# Patient Record
Sex: Female | Born: 1960 | Race: Black or African American | Hispanic: No | Marital: Single | Smoking: Current every day smoker
Health system: Southern US, Community
[De-identification: ages and names within clinical notes are randomized; demographics above are authoritative.]

## PROBLEM LIST (undated history)

## (undated) DIAGNOSIS — I10 Essential (primary) hypertension: Secondary | ICD-10-CM

## (undated) HISTORY — PX: COLONOSCOPY: SHX174

## (undated) HISTORY — DX: Essential (primary) hypertension: I10

---

## 2006-07-28 ENCOUNTER — Ambulatory Visit (HOSPITAL_COMMUNITY): Admission: RE | Admit: 2006-07-28 | Discharge: 2006-07-28 | Payer: Self-pay | Admitting: Obstetrics & Gynecology

## 2007-12-11 ENCOUNTER — Ambulatory Visit (HOSPITAL_COMMUNITY): Admission: RE | Admit: 2007-12-11 | Discharge: 2007-12-11 | Payer: Self-pay | Admitting: Family Medicine

## 2007-12-14 ENCOUNTER — Ambulatory Visit: Payer: Self-pay | Admitting: Obstetrics and Gynecology

## 2007-12-25 ENCOUNTER — Ambulatory Visit: Payer: Self-pay | Admitting: Internal Medicine

## 2007-12-27 ENCOUNTER — Ambulatory Visit: Payer: Self-pay | Admitting: *Deleted

## 2008-02-15 ENCOUNTER — Ambulatory Visit: Payer: Self-pay | Admitting: Obstetrics & Gynecology

## 2008-03-19 ENCOUNTER — Ambulatory Visit: Payer: Self-pay | Admitting: Obstetrics & Gynecology

## 2008-03-19 ENCOUNTER — Ambulatory Visit: Payer: Self-pay | Admitting: Internal Medicine

## 2008-03-19 LAB — CONVERTED CEMR LAB
Albumin: 4.4 g/dL (ref 3.5–5.2)
Basophils Relative: 2 % — ABNORMAL HIGH (ref 0–1)
CO2: 24 meq/L (ref 19–32)
Creatinine, Ser: 0.72 mg/dL (ref 0.40–1.20)
Eosinophils Absolute: 0.1 10*3/uL (ref 0.0–0.7)
Eosinophils Relative: 1 % (ref 0–5)
HCT: 40.5 % (ref 36.0–46.0)
Lymphs Abs: 2.1 10*3/uL (ref 0.7–4.0)
MCV: 95.3 fL (ref 78.0–100.0)
Monocytes Absolute: 0.6 10*3/uL (ref 0.1–1.0)
Monocytes Relative: 8 % (ref 3–12)
Neutro Abs: 4.6 10*3/uL (ref 1.7–7.7)
Neutrophils Relative %: 61 % (ref 43–77)
Potassium: 3.8 meq/L (ref 3.5–5.3)
RBC: 4.25 M/uL (ref 3.87–5.11)
RDW: 13.8 % (ref 11.5–15.5)
Sodium: 140 meq/L (ref 135–145)
Total Bilirubin: 0.4 mg/dL (ref 0.3–1.2)
Total Protein: 7.2 g/dL (ref 6.0–8.3)
WBC: 7.5 10*3/uL (ref 4.0–10.5)

## 2008-04-02 ENCOUNTER — Ambulatory Visit: Payer: Self-pay | Admitting: Obstetrics & Gynecology

## 2008-04-02 ENCOUNTER — Inpatient Hospital Stay (HOSPITAL_COMMUNITY): Admission: RE | Admit: 2008-04-02 | Discharge: 2008-04-05 | Payer: Self-pay | Admitting: Obstetrics & Gynecology

## 2008-04-02 ENCOUNTER — Encounter: Payer: Self-pay | Admitting: Obstetrics & Gynecology

## 2008-04-12 ENCOUNTER — Ambulatory Visit: Payer: Self-pay | Admitting: Obstetrics & Gynecology

## 2008-05-03 ENCOUNTER — Ambulatory Visit: Payer: Self-pay | Admitting: Obstetrics & Gynecology

## 2009-11-23 IMAGING — US US PELVIS COMPLETE MODIFY
1 series · 13 of 25 positions shown · non-contrast
Comparison: None

CLINICAL DATA: Large pelvic mass.  Cannot visualize cervix on GYN
exam.

TRANSABDOMINAL AND TRANSVAGINAL ULTRASOUND OF PELVIS
TECHNIQUE: Both transabdominal and transvaginal ultrasound
examinations of the pelvis were performed including evaluation of
the uterus, ovaries, adnexal regions, and pelvic cul-de-sac.

[Series 1: us pelvis complete modify · 0.33mm/px · 13 of 43 slices shown]
[im 1/43]
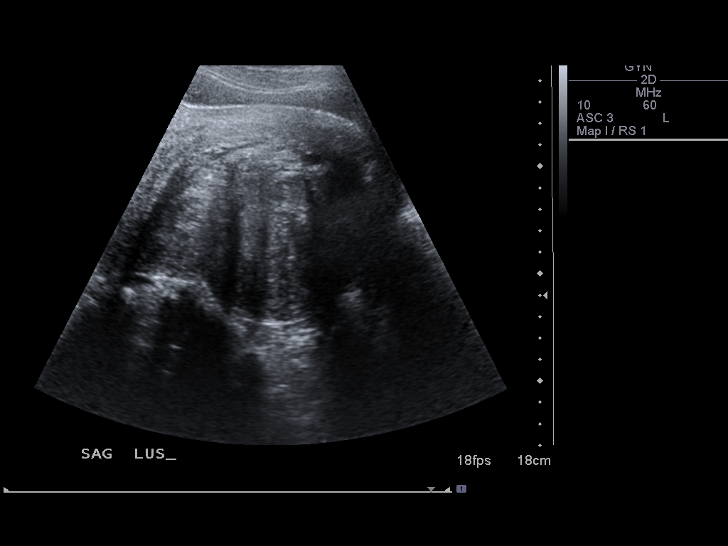
[im 4/43]
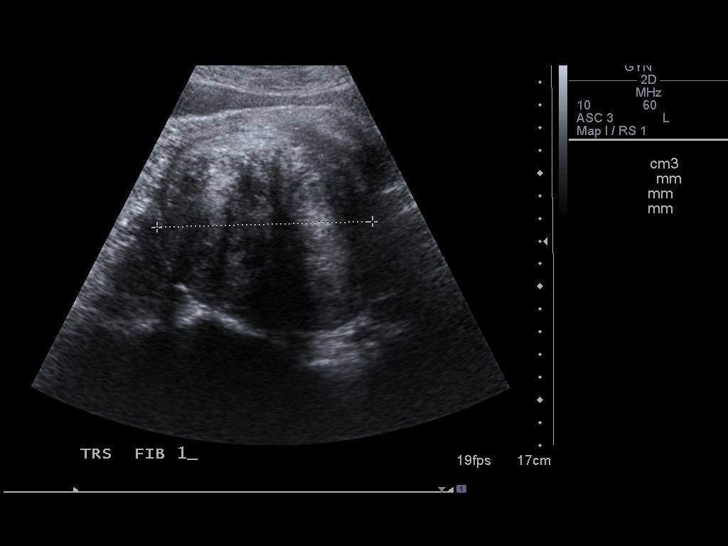
[im 8/43]
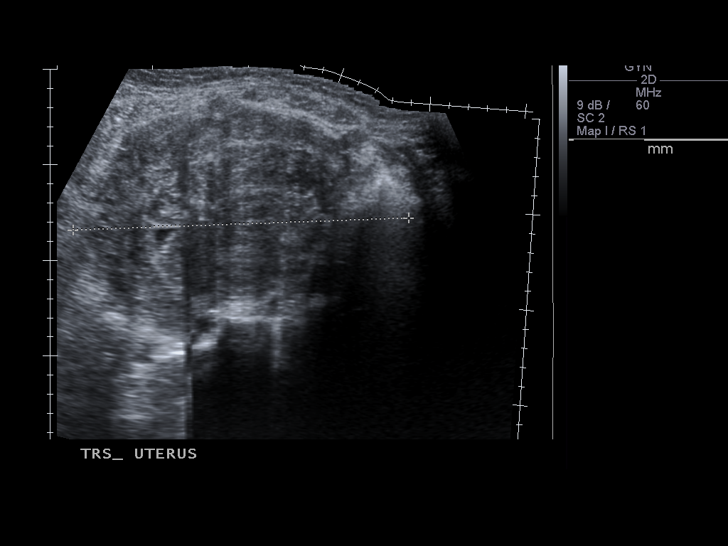
[im 11/43]
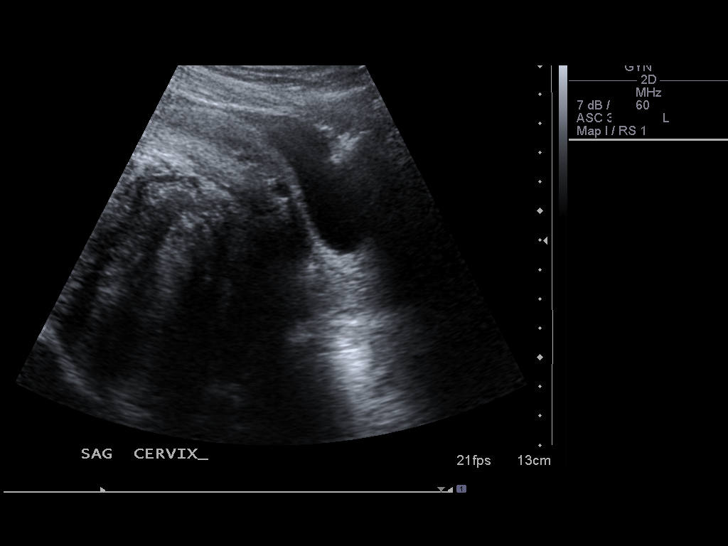
[im 15/43]
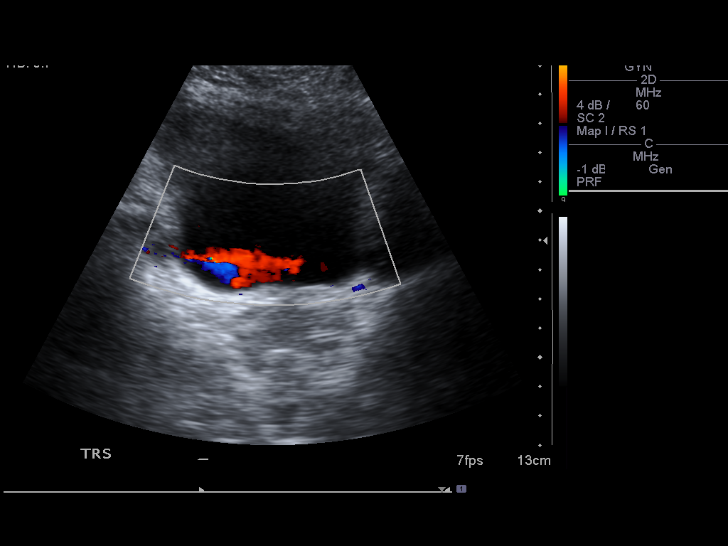
[im 18/43]
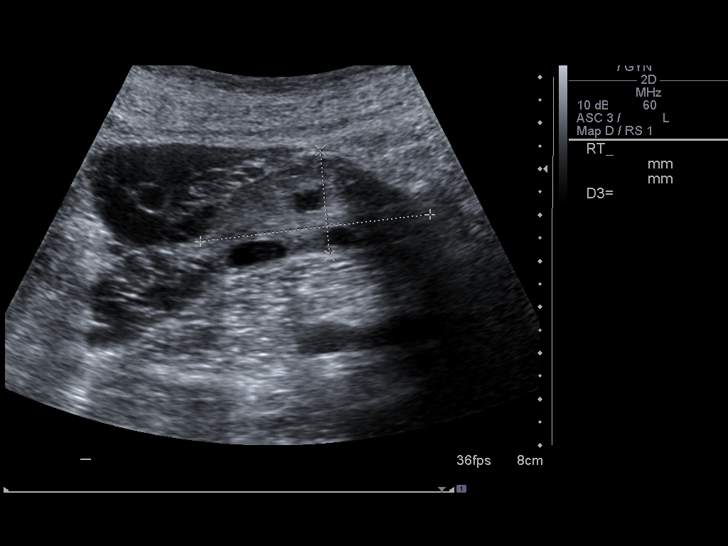
[im 22/43]
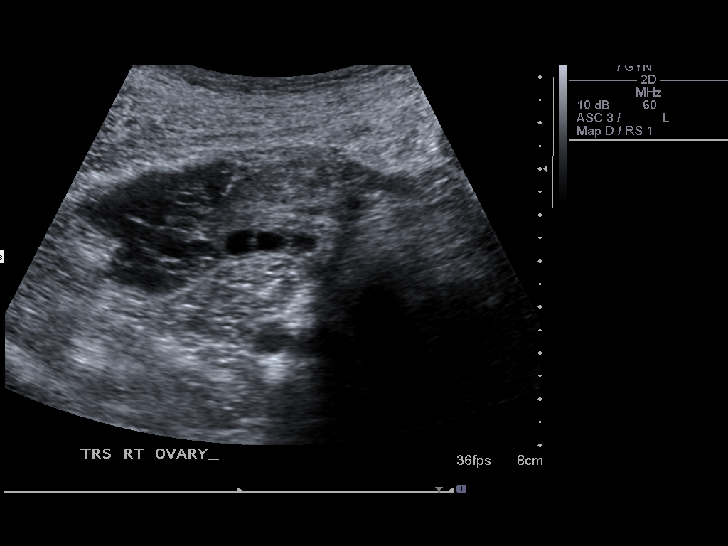
[im 25/43]
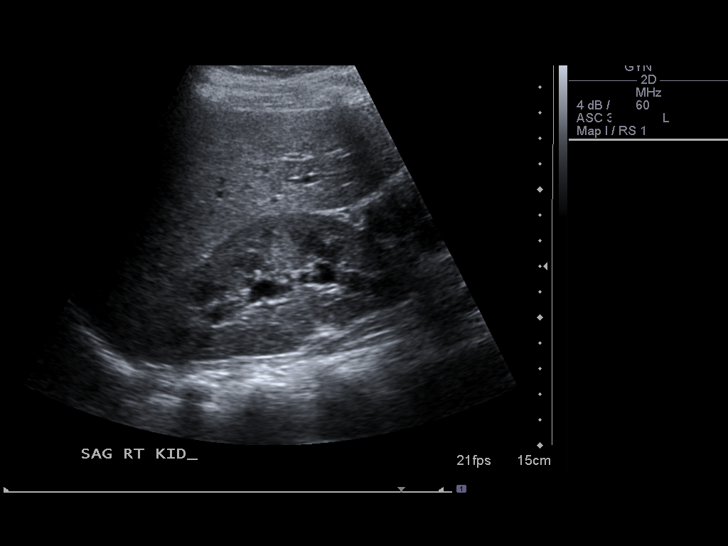
[im 29/43]
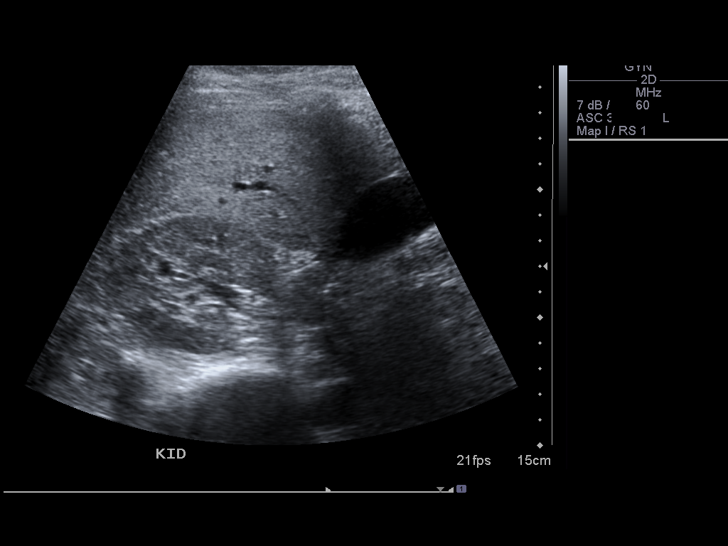
[im 32/43]
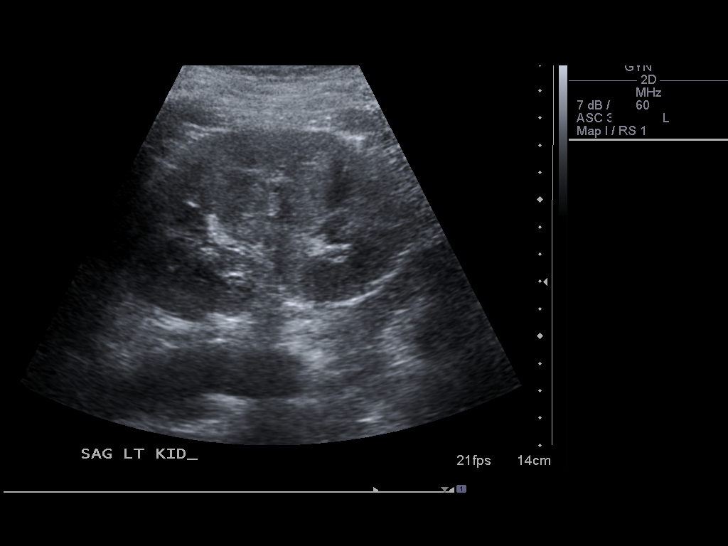
[im 36/43]
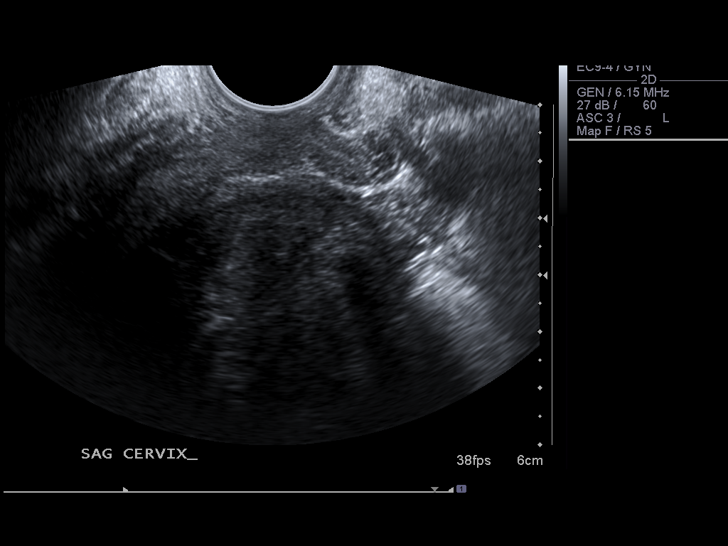
[im 39/43]
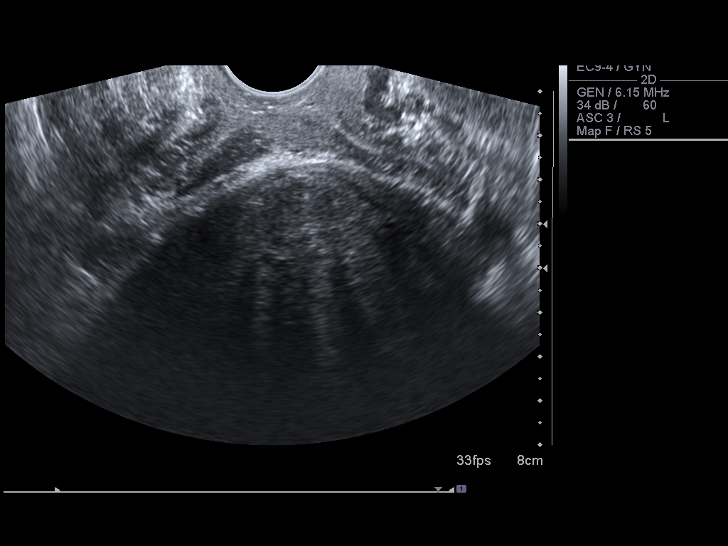
[im 43/43]
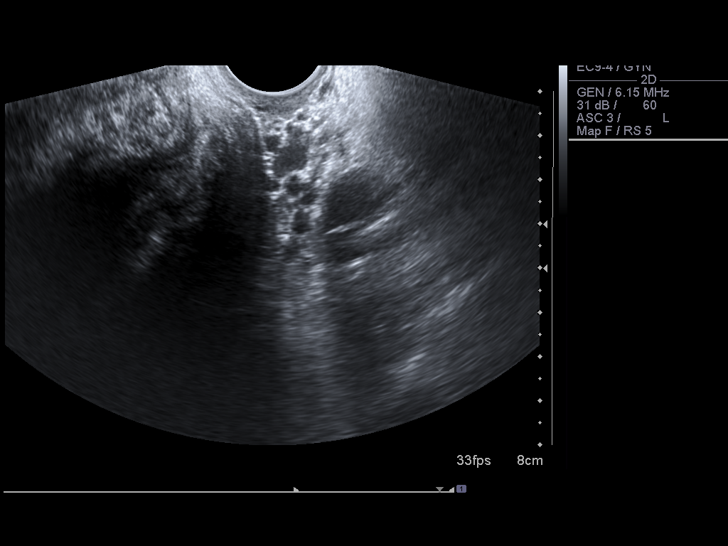

[13 of 25 positions shown; findings below may reference images not displayed]

FINDINGS: The uterus is markedly enlarged, measuring approximately
26 cm length by 11 cm AP diameter by 18 cm and transverse diameter.

[place] distinct fibroids are visualized.  The largest of these is
located in the posterior uterine body measuring 13.5 x 11.5 x
cm.  This fibroid has a submucosal component displacing the
endometrial cavity anteriorly.  Endometrial thickness measures 7
mm.

Two other fibroids are noted in the uterine fundus, measuring
x 9.0 x 9.5 cm and 6.3 x 6.0 x 5.5 cm.

The right ovary is visualized on transabdominal sonography and is
normal in appearance.  The left ovary is not directly visualized
but no adnexal masses are seen by transabdominal or transvaginal
approach.

Limited images of the kidneys show minimal right renal
pelvicaliectasis.  There is no evidence of left renal
pelvicaliectasis.
IMPRESSION: 1.  Markedly enlarged uterus, with at least three fibroids
measuring up to 13.5 cm.
2.  Normal right ovary.  Nonvisualization of left ovary.
3.  Minimal right renal pelvicaliectasis noted.

## 2010-04-26 HISTORY — PX: TOTAL ABDOMINAL HYSTERECTOMY: SHX209

## 2010-05-17 ENCOUNTER — Encounter: Payer: Self-pay | Admitting: *Deleted

## 2010-09-08 NOTE — Discharge Summary (Signed)
Michele George, Michele George              ACCOUNT NO.:  [id number]   MEDICAL RECORD NO.:  [id number]          PATIENT TYPE:  INP   LOCATION:  9306                          [facility]ILITY[facility]   PHYSICIAN:  Norton Blizzard, MD    DATE OF BIRTH:  08-15-1960   DATE OF ADMISSION:  04/02/2008  DATE OF DISCHARGE:  04/05/2008                               DISCHARGE SUMMARY   ADMISSION DIAGNOSIS:  Symptomatic fibroid uterus.   DISCHARGE DIAGNOSIS:  Status post total abdominal hysterectomy.   PERTINENT LABORATORY DATA:  Preoperative hemoglobin was 14 and a  postoperative hemoglobin was 11.3 with a platelet count of 260,000.   BRIEF HOSPITAL COURSE:  The patient is a 50 year old gravida 2, para 2,  who was seen in the clinic for a large fibroid uterus measuring 26 cm x  11 cm x 18 cm.  The patient did receive a dose of Depo-Lupron, but this  did not result in significant reduction of the bulk of her fibroid  uterus.  She underwent total abdominal hysterectomy on April 02, 2008,  which was uncomplicated.  For further details of the surgery, please  refer to separate dictated operative report.  On the day of discharge,  the patient was ambulating and voiding without difficulty.  Her pain was  controlled on oral pain medications.  She was tolerating a regular diet,  passing flatus, and also had a bowel movement.  She was deemed stable  for discharge to home.   DISCHARGE CONDITION:  Stable.   DISCHARGE INSTRUCTIONS:  The patient was given the routine postoperative  instructions including no driving while on Percocet, no lifting anything  greater than 15 pounds for the next 8 weeks, no sexual activity for the  next 8 weeks.  She was told she could shower, but to avoid sitting in  the bathtub or in pools of water.  She was told to keep her incision  clean and dry.   MEDICATIONS ON DISCHARGE:  The patient was told to resume her home  medications of Toprol and Hydrochlorothiazide as prescribed by her  primary care doctor.  She was also prescribed Percocet 5/325 one to two  tablets p.o. q.6 h p.r.n. pain, Ibuprofen 600 mg p.o. q.6 h. p.r.n.  pain, Colace 100 mg p.o. b.i.d. p.r.n. constipation.   FOLLOWUP APPOINTMENTS:  The patient is to return to the GYN Clinic for  staple removal on April 11, 2008, at 1:15 p.m.  She also has another  appointment for postoperative evaluation and examination on May 03, 2008, at 9:30 a.m.      Norton Blizzard, MD  Electronically Signed     UAD/MEDQ  D:  04/05/2008  T:  04/05/2008  Job:  [id number]

## 2010-09-08 NOTE — Group Therapy Note (Signed)
NAME:  Michele George, Michele George NO.:  [id number]   MEDICAL RECORD NO.:  [id number]          PATIENT TYPE:  WOC   LOCATION:  [facility]                   FACILITY:  [facility]   PHYSICIAN:  Johnella Moloney, MD        DATE OF BIRTH:  12-24-1960   DATE OF SERVICE:                                  CLINIC NOTE   The patient is a 50 year old gravida 2, para 2, who is status post a  total abdominal hysterectomy and lysis adhesions on April 02, 2008.  The patient was last seen on April 12, 2008, for removal of staples  from her infraumbilical vertical incision.  Since that visit, the  patient reports of having mild soreness around the incision.  She denies  any bleeding or any other concerns.   PHYSICAL EXAMINATION:  VITAL SIGNS:  The patient is afebrile.  Her blood  pressure is elevated to 138/100, but she said that she had not taken her  blood pressure medications today, weight is 155 pounds, and height 5  feet 5 inches.  GENERAL:  No apparent distress.  ABDOMEN:  Soft and nontender.  Incision is healing well.  It is a  vertical infraumbilical incision.  She does have some mild hardening on  the superior aspect of incision, but no erythema or fluctuance that was  noted.  This is just likely due to inflammation of old surgery probably  around the stitch in that area.  There is no intervention to be done  about it.  The patient was just told to keep an eye on that and to alert  [place] if it gets worse.  PELVIC:  The patient has normal external female genitalia.  Speculum  exam revealed pink, well rugated vagina, and the vaginal cuff has healed  very well.  No abnormal drainage or discharge.   IMPRESSION:  The patient is a 50 year old female, here for postoperative  check.  The patient is doing well.  She was told to continue to take  pain medications as needed.  Of note, the patient was noted to have her  last mammogram in October 2008.  A routine mammogram was scheduled for  her at  this visit.  She was told to follow up for annual exam or earlier  for any further gynecologic problems.           ______________________________  Johnella Moloney, MD     UD/MEDQ  D:  05/03/2008  T:  05/04/2008  Job:  [id number]

## 2010-09-08 NOTE — Group Therapy Note (Signed)
Michele George, LINHART NO.:  [id number]   MEDICAL RECORD NO.:  [id number]          PATIENT TYPE:  WOC   LOCATION:  [facility]                   FACILITY:  [facility]   PHYSICIAN:  Argentina Donovan, MD        DATE OF BIRTH:  Sep 05, 1960   DATE OF SERVICE:  12/14/2007                                  CLINIC NOTE   The patient is a 50 year old African American female, gravida 2, para 2-  0-0-2, who was referred from [facility] because of a large fibroid  uterus.  At ultrasound, the uterus measured 26 cm in length x 11 in AP  diameter, and 18 transverse, with fibroids, that make a Pap smear  impossible to do because the cervix is not palpable because of the  pressure.  She also has a slight right renal pelviectasis up from the  top pressure on the ureter from the mass.  She is completely  asymptomatic otherwise and is in good health, with a blood pressure of  135/80, and a pulse of 95.  The patient is 5 feet 5 inches tall and  weighs 176 pounds.  She has no allergies.  She has had no previous  surgery.  She has had 2 normal vaginal deliveries.  She does socially  she does not smoke more than a cigarette every few days.  She drinks no  alcohol and takes no illicit drugs.   The plan is to give the patient Depo-Lupron 11.25 and see her back in 2  months to see how this is shrinking down, hopefully to make the incision  smaller in order to remove this enormous mass.  On bimanual pelvic exam,  the anterior wall of the vagina is pushed in.  The cervix I cannot feel  digitally or see visually, and the anterior wall is completely smoothed  out by the mass, which makes putting a speculum in there difficult,  although she says she is voiding fine and does not have a problem with  her bowel movements.   IMPRESSION:  Symptomatic enormous fibroid tumor.   PLAN:  To shrink down with Depo-Lupron, and eventual hysterectomy.           ______________________________  Argentina Donovan,  MD     PR/MEDQ  D:  12/14/2007  T:  12/14/2007  Job:  [id number]

## 2010-09-08 NOTE — Op Note (Signed)
Michele George, Michele George              ACCOUNT NO.:  [id number]   MEDICAL RECORD NO.:  [id number]          PATIENT TYPE:  INP   LOCATION:  9306                          [facility]   PHYSICIAN:  Norton Blizzard, MD    DATE OF BIRTH:  08-29-1960   DATE OF PROCEDURE:  04/02/2008  DATE OF DISCHARGE:                               OPERATIVE REPORT   PREOPERATIVE DIAGNOSIS:  Symptomatic fibroid uterus.   POSTOPERATIVE DIAGNOSIS:  Symptomatic fibroid uterus.   PROCEDURES:  Total abdominal hysterectomy and lysis of adhesions.   SURGEON:  Norton Blizzard, MD   ASSISTANT:  Shelbie Proctor. Shawnie Pons, MD   ANESTHESIA:  General.   IV FLUIDS:  700 mL of lactated Ringer.   ESTIMATED BLOOD LOSS:  700 mL.   URINE OUTPUT:  75 mL.   INDICATIONS:  The patient is a 50 year old gravida 2, para 2, who  presented with a large symptomatic fibroid uterus initially measuring 26  x 18 x 11 cm on ultrasound.  The patient was given Depo-Lupron 11.25 mg  in the last 3 months to see if this could reduce the bulk of her fibroid  uterus.  Prior to surgery, the risks of total abdominal hysterectomy  including bleeding, infection, injury to surrounding organs, need for  additional procedures were explained to the patient and written informed  consent was obtained.   FINDINGS:  A 24-week size fibroid uterus, omental adhesions at the top  of the uterus, and normal adnexa bilaterally.   SPECIMENS:  Uterus plus cervix.   DISPOSITION:  Specimen to Pathology.   COMPLICATIONS:  None.   PROCEDURE IN DETAIL:  The patient received preoperative IV antibiotics  approximately 30 minutes prior to surgery.  Compression boots were also  applied to the lower extremities in the preoperative area.  The patient  was then taken to the operating room where general anesthesia was placed  and found to be adequate.  The patient was then placed in the dorsal  supine position.  The abdomen and perineum were prepped and draped in  the  usual manner.  A Foley catheter was inserted into the bladder and  attached to constant gravity.  An infraumbilical vertical skin incision  was made in the skin using a scalpel.  This incision was taken down to  the layer of fascia using electrocautery with care given to maintain  good hemostasis.  The fascia was incised in the midline, and the  incision was extended superiorly and inferiorly using electrocautery  without difficulty.  The rectus muscles were split bluntly in the  midline, and the peritoneum was entered bluntly without complication.  This incision was extended superiorly and inferiorly in a blunt fashion  with care given to prevent bowel or bladder injury.  Upon entry into the  abdominal cavity, the fibroid uterus was noted to occupy most of the  pelvis.  An attempt was made to deliver the uterus up through the  incision.  At this point, it was noted that there were omental adhesions  to the top of the uterus.  These adhesions were clamped, and cut  and  suture ligated.  Excellent hemostasis was noted.  At this point, the  uterus was able to be delivered up out of the incision.  The round  ligaments on each side were then recognized, clamped, suture ligated and  transected with electrocautery allowing entry into the broad ligament.  Of note, all stitches used during the surgery with 0 Vicryl unless  otherwise noted. At this point, a bladder flap was created across the  anterior leaf of the broad ligament and the bladder was bluntly  dissected off the lower uterine segment and cervix with good hemostasis.  It was noted that the patient had a large fibroid in the lower uterine  segment, limiting our access  to the uterine vessels.  Decision was made  to proceed with the removal this fibroid in order to debulk uterus.  Vasopressin was injected on the serosal surface of the fibroid and  electrocautery was used to make a transverse incision into the capsule  of the fibroid.  The  fibroid was then grasped with a hook and the  fibroid was freed from the surrounding myometrium using blunt fashion.  Once the fibroid was excised, the defect was closed with a running  suture.  Attention was then turned to the broad ligament where a hole  was created in a posterior broad ligament and adnexa were clamped on the  patient's right side.  The pedicle was then cut and doubly suture  ligated and good hemostasis was noted.  This procedure was repeated in  identical fashion on the left side allowing both adnexae to stay in  place.  The broad ligament was then serially clamped, cut, and ligated  bilaterally.  The uterine arteries were then skeletonized, clamped, cut,  and suture ligated with care given to prevent ureteral injury.  The  uterosacral ligaments were then clamped, cut, and suture ligated  bilaterally. The cardinal ligaments were clamped, cut, and ligated  bilaterally.  Acutely curved clamps were placed across the vagina just  under cervix, and the specimen was amputated and sent to Pathology.  The  vaginal cuff was closed with figure-of-eight sutures with care given to  incorporate the anterior pubocervical fascia in the posterior  rectovaginal fascia.  Good hemostasis was noted.  The pelvis was  irrigated and there were a few areas of bleeding that were controlled  using figure-of-eight stitches.  Overall, good hemostasis was confirmed.  The ureters were examined bilaterally and noted to be peristalsing  freely.  Small amount of bleeding was noted on the bladder flap, and  sutures were placed to control the bleeding.  Avitene was also placed in  this area to help with hemostasis.  Hemostasis was then confirmed on all  pedicles, the vaginal cuff, and along the pelvic sidewall.  The fascia  and peritoneum were closed in end-to-end fashion with a running  continuous looped PDS suture.  The subcutaneous tissues were then  irrigated and hemostasis confirmed.  The skin was  then closed with  staples.  The patient tolerated the procedure well.  Sponge, instrument,  and needle counts were correct x2.  She was taken to the recovery room  in stable condition.      Norton Blizzard, MD  Electronically Signed     UAD/MEDQ  D:  04/02/2008  T:  04/03/2008  Job:  [id number]

## 2010-09-08 NOTE — Group Therapy Note (Signed)
NAME:  Michele George, Michele George NO.:  [id number]   MEDICAL RECORD NO.:  [id number]          PATIENT TYPE:  WOC   LOCATION:  [facility]                   FACILITY:  [facility]   PHYSICIAN:  Johnella Moloney, MD        DATE OF BIRTH:  Jan 15, 1961   DATE OF SERVICE:                                  CLINIC NOTE   HISTORY:  The patient is a 50 year old gravida 2, para 2 who was last  seen on December 14, 2007 for evaluation of a large fibroid uterus.  At  the ultrasound, her fibroid uterus was noted to measure 26 cm x 11 cm x  18 cm and also a Pap smear was unable to be done because her cervix was  not palpable on examination during that encounter.  She was also noted  to have slight right renal pelviectasis from the top of the uterus  producing pressure on the ureter.  She was otherwise asymptomatic.  At  the end of this visit, the patient was given Depo-Lupron 48.25 mg to see  if this could help reduce the bulk of the fibroid uterus.  She was told  to follow up today for reevaluation and for further surgical planning.  Today, the patient reports she continues to have some pain.  She does  report a significant decrease in size of her fibroid uterus over the  past 2 months, but she is very interested in moving ahead with surgical  management.  She says that she has not had any bleeding since the Lupron  injection.   PAST OB/GYN HISTORY:  The patient has a history of 2 vaginal deliveries.  She has had normal menstrual periods since age 50 with are regular. with are regular.  These periods last about 5 days.  Her last menstrual period was in  August 2009 before the Lupron injection.  The patient denies any history  of abnormal Pap smears.  She does say that she had a Pap that was done  in October 2008 which was normal.  She also had a mammogram in October  2008 which was normal.  The patient is not on any birth control method  and is currently sexually active.   PAST MEDICAL HISTORY:  Dental disease.   PAST  SURGICAL HISTORY:  1. Dental surgery.  2. No abdominal surgery.   MEDICATIONS:  Amoxicillin 500 mg p.o. t.i.d.  This medication was  prescribed by the patient's dentist.   ALLERGIES:  NO KNOWN DRUG ALLERGIES.  She is not allergic to latex.   SOCIAL HISTORY:  The patient is currently unemployed.  She does not  smoke, use any illicit drugs or drink alcohol.  She denies any past or  present history of sexual or physical abuse.   FAMILY HISTORY:  History is remarkable for diabetes.  No gynecologic  cancers.   REVIEW OF SYSTEMS:  Only remarkable for weight gain which she attributes  to the enlarging fibroid uterus and abdominal pain.   PHYSICAL EXAMINATION:  VITAL SIGNS:  Temperature 97.6, pulse 102,  respirations 20, blood pressure 133/91, weight 169.1 pounds, height 5  feet 5-1/2 inches.  GENERAL:  No apparent respiratory distress.  LUNGS:  Clear to auscultation bilaterally.  HEART:  Regular rate and rhythm.  BREASTS:  Symmetric in size.  No abnormal masses, drainage, skin changes  or lymphadenopathy noted.  ABDOMEN:  Fibroid uterus was able to be palpated about 2 cm above the  umbilicus.  There was mild tenderness to palpation.  Abdomen was  otherwise soft.  No rebound or guarding on palpation of the fibroid  uterus.  PELVIC:  The patient had normal external female genitalia.  On speculum  examination, I was not able to visualize her cervix.  On bimanual exam,  her cervix was noted to be tucked behind the superior portion of the  pubic symphysis and could not be gotten into view using a speculum.  At  this point, attempt to obtain a Pap smear was aborted.  There was no  cervical motion tenderness.  Adnexa were unable to be palpated due to  large fibroid uterus.  EXTREMITIES:  No cyanosis, clubbing or edema and nontender.   ASSESSMENT/PLAN:  The patient is a 50 year old gravida 2, para 2, who is  seen here for a symptomatic fibroid uterus.  The patient desires  definitive  surgery.  She was told she will be booked for a total  abdominal hysterectomy and a surgical scheduler will be contacting her  in a few days with the time and date.  The patient will get another  injection of Depo-Lupron in November to see if this could debulk her  fibroid uterus any more.  We will decide on the incision on the day of  surgery.  It is either going to be infraumbilical, vertical versus a  Pfannenstiel skin incision.  The patient was told to come back to the  clinic or go to the  emergency room for any further concerns.           ______________________________  Johnella Moloney, MD     UD/MEDQ  D:  02/15/2008  T:  02/15/2008  Job:  [id number]

## 2011-01-29 LAB — CBC
HCT: 41.3 % (ref 36.0–46.0)
Hemoglobin: 11.3 g/dL — ABNORMAL LOW (ref 12.0–15.0)
Hemoglobin: 14 g/dL (ref 12.0–15.0)
MCHC: 33.8 g/dL (ref 30.0–36.0)
MCV: 96.3 fL (ref 78.0–100.0)
RBC: 3.36 MIL/uL — ABNORMAL LOW (ref 3.87–5.11)
RBC: 4.29 MIL/uL (ref 3.87–5.11)
RDW: 13.6 % (ref 11.5–15.5)
WBC: 11.1 10*3/uL — ABNORMAL HIGH (ref 4.0–10.5)

## 2011-01-29 LAB — BASIC METABOLIC PANEL
CO2: 26 mEq/L (ref 19–32)
Calcium: 9.5 mg/dL (ref 8.4–10.5)
Chloride: 99 mEq/L (ref 96–112)
GFR calc Af Amer: >60 mL/min (ref >60)
Glucose, Bld: 115 mg/dL — ABNORMAL HIGH (ref 70–99)
Potassium: 3.6 mEq/L (ref 3.5–5.1)
Sodium: 138 mEq/L (ref 135–145)

## 2011-01-29 LAB — TYPE AND SCREEN: Antibody Screen: NEGATIVE

## 2019-11-27 ENCOUNTER — Ambulatory Visit (HOSPITAL_COMMUNITY)
Admission: EM | Admit: 2019-11-27 | Discharge: 2019-11-27 | Disposition: A | Discharge status: Home / Self Care | Payer: Self-pay | Attending: Family Medicine | Admitting: Family Medicine

## 2019-11-27 ENCOUNTER — Encounter (HOSPITAL_COMMUNITY): Payer: Self-pay

## 2019-11-27 ENCOUNTER — Other Ambulatory Visit: Payer: Self-pay

## 2019-11-27 DIAGNOSIS — I10 Essential (primary) hypertension: Secondary | ICD-10-CM

## 2019-11-27 MED ORDER — AMLODIPINE BESYLATE 5 MG PO TABS: 5.0000 mg | ORAL_TABLET | Freq: Every day | ORAL | 0 refills | Status: DC

## 2019-11-27 NOTE — ED Provider Notes (Signed)
[facility]    CSN: [id number] Arrival date & time: 11/27/19  1614      History   Chief Complaint Chief Complaint  Patient presents with   Hypertension    HPI Michele George is a 59 y.o. female.   She is presenting with elevated blood pressure.  She has no history of hypertension.  She went for a job physical and her blood pressure was noted to be elevated.  She denies any symptoms today.  Has never been treated previously.  HPI  History reviewed. No pertinent past medical history.  There are no problems to display for this patient.   History reviewed. No pertinent surgical history.  OB History   No obstetric history on file.      Home Medications    Prior to Admission medications   Medication Sig Start Date End Date Taking? Authorizing Provider  amLODipine (NORVASC) 5 MG tablet Take 1 tablet (5 mg total) by mouth daily. 11/27/19   Rosemarie Ax, MD    Family History Family History  Family history unknown: Yes    Social History Social History   Tobacco Use   Smoking status: Light Tobacco Smoker  Substance Use Topics   Alcohol use: Not on file   Drug use: Not on file     Allergies   Patient has no known allergies.   Review of Systems Review of Systems\ See HPI  Physical Exam Triage Vital Signs ED Triage Vitals  Enc Vitals Group     BP 11/27/19 1744 (!) 180/117     Pulse Rate 11/27/19 1744 96     Resp 11/27/19 1744 18     Temp 11/27/19 1744 98.1 F (36.7 C)     Temp Source 11/27/19 1744 Oral     SpO2 11/27/19 1744 99 %     Weight --      Height --      Head Circumference --      Peak Flow --      Pain Score 11/27/19 1743 0     Pain Loc --      Pain Edu? --      Excl. in [place]? --    No data found.  Updated Vital Signs BP (!) 175/100 (BP Location: Left Arm)    Pulse 96    Temp 98.1 F (36.7 C) (Oral)    Resp 18    SpO2 99%   Visual Acuity Right Eye Distance:   Left Eye Distance:   Bilateral Distance:    Right  Eye Near:   Left Eye Near:    Bilateral Near:     Physical Exam Gen: NAD, alert, cooperative with exam, well-appearing ENT: normal lips, normal nasal mucosa,  Eye: normal EOM, normal conjunctiva and lids CV: Regular rate and rhythm, S1-S2 Resp: no accessory muscle use, non-labored,  Skin: no rashes, no areas of induration  Neuro: normal tone, normal sensation to touch    UC Treatments / Results  Labs (all labs ordered are listed, but only abnormal results are displayed) Labs Reviewed - No data to display  EKG   Radiology No results found.  Procedures Procedures (including critical care time)  Medications Ordered in UC Medications - No data to display  Initial Impression / Assessment and Plan / UC Course  I have reviewed the triage vital signs and the nursing notes.  Pertinent labs & imaging results that were available during my care of the patient were reviewed by me and  considered in my medical decision making (see chart for details).     [name] is a 59 year old female is presenting with new onset hypertension.  Provide amlodipine and counseled on having close follow-up and daily monitoring.  Final Clinical Impressions(s) / UC Diagnoses   Final diagnoses:  Essential hypertension     Discharge Instructions     Please continue to check your blood pressure  If the readings on the blood pressure are above 140/90 then please follow up      ED Prescriptions    Medication Sig Dispense Auth. Provider   amLODipine (NORVASC) 5 MG tablet Take 1 tablet (5 mg total) by mouth daily. 30 tablet Rosemarie Ax, MD     PDMP not reviewed this encounter.   Rosemarie Ax, MD 11/27/19 [phone]

## 2019-11-27 NOTE — Discharge Instructions (Signed)
Please continue to check your blood pressure  If the readings on the blood pressure are above 140/90 then please follow up

## 2019-11-27 NOTE — ED Triage Notes (Signed)
Pt presents with elevated blood pressure today.

## 2021-05-19 ENCOUNTER — Ambulatory Visit: Payer: Self-pay | Admitting: Nurse Practitioner

## 2021-05-19 ENCOUNTER — Other Ambulatory Visit: Payer: Self-pay

## 2021-05-27 ENCOUNTER — Encounter: Payer: Self-pay | Admitting: Family

## 2021-05-28 ENCOUNTER — Encounter: Payer: Self-pay | Admitting: Family

## 2021-05-28 ENCOUNTER — Ambulatory Visit (INDEPENDENT_AMBULATORY_CARE_PROVIDER_SITE_OTHER): Payer: 59 | Admitting: Family

## 2021-05-28 ENCOUNTER — Other Ambulatory Visit: Payer: Self-pay

## 2021-05-28 VITALS — BP 140/100 | HR 102 | Temp 97.8°F | Resp 16 | Ht 66.34 in | Wt 181.2 lb

## 2021-05-28 DIAGNOSIS — Z7689 Persons encountering health services in other specified circumstances: Secondary | ICD-10-CM

## 2021-05-28 DIAGNOSIS — I10 Essential (primary) hypertension: Secondary | ICD-10-CM

## 2021-05-28 DIAGNOSIS — E663 Overweight: Secondary | ICD-10-CM

## 2021-05-28 DIAGNOSIS — R000 Tachycardia, unspecified: Secondary | ICD-10-CM

## 2021-05-28 DIAGNOSIS — H04123 Dry eye syndrome of bilateral lacrimal glands: Secondary | ICD-10-CM | POA: Diagnosis not present

## 2021-05-28 DIAGNOSIS — F17209 Nicotine dependence, unspecified, with unspecified nicotine-induced disorders: Secondary | ICD-10-CM

## 2021-05-28 DIAGNOSIS — Z6828 Body mass index (BMI) 28.0-28.9, adult: Secondary | ICD-10-CM

## 2021-05-28 DIAGNOSIS — Z716 Tobacco abuse counseling: Secondary | ICD-10-CM

## 2021-05-28 DIAGNOSIS — F5101 Primary insomnia: Secondary | ICD-10-CM

## 2021-05-28 MED ORDER — METOPROLOL SUCCINATE ER 25 MG PO TB24: 25.0000 mg | ORAL_TABLET | Freq: Every day | ORAL | 3 refills | Status: DC

## 2021-05-28 MED ORDER — CLONIDINE HCL 0.1 MG PO TABS
0.1000 mg | ORAL_TABLET | Freq: Once | ORAL | Status: AC
  Administered 2021-05-28: 0.1 mg via ORAL

## 2021-05-28 NOTE — Progress Notes (Signed)
Provider: Marlowe Sax FNP-C   Yuvraj Pfeifer, Nelda Bucks, NP  Patient Care Team: Avelyn Touch, Nelda Bucks, NP as PCP - General (Family Medicine)  Extended Emergency Contact Information Primary Emergency Contact: FOUST,PATRICIA Address: [place], [place] [address] [place] Phone: [phone] Mobile Phone: [phone] Relation: Mother  Code Status:  Full Code  Goals of care: Advanced Directive information Advanced Directives 05/27/2021  Does Patient Have a Medical Advance Directive? No  Would patient like information on creating a medical advance directive? No - Patient declined     Chief Complaint  Patient presents with   Establish Care    New Patient.    Concerns    Feeling weak, and Losing weight ongoing for 3 months.     HPI:  Pt is a 61 y.o. female seen today establish care here at [facility] for medical management of chronic diseases.Has a medical history of Hypertension.States was prescribed Amlodipine one time in urgent care but has not been taking any medication.Has not seen a provider in about 10 years due to insurance.states cannot see good.denies any headache,dizziness,,chest tightness,palpitation,chest pain or shortness of breath.      She complains of feeling weak and fatigue.Weight loss x 3 months.Does not recall her actual weight.No cold or heat intolerance.sometimes sweats at night.states family has told her that she has lost a lot of weight.Has also noticed it when choosing her clothes in the wardrobe. States something is not right with her health. She denies any nervousness,constipation or feeling depressed.    Past Medical History:  Diagnosis Date   Hypertension    History reviewed. No pertinent surgical history.  No Known Allergies  Allergies as of 05/28/2021   No Known Allergies      Medication List        Accurate as of May 28, 2021 11:12 AM. If you have any questions, ask your nurse or  doctor.          STOP taking these medications    amLODipine 5 MG tablet Commonly known as: NORVASC Stopped by: Sandrea Hughs, NP       TAKE these medications    metoprolol succinate 25 MG 24 hr tablet Commonly known as: TOPROL-XL Take 1 tablet (25 mg total) by mouth daily. Started by: Sandrea Hughs, NP        Review of Systems  Constitutional:  Positive for fatigue and unexpected weight change. Negative for appetite change, chills and fever.  HENT:  Positive for congestion. Negative for dental problem, ear discharge, ear pain, facial swelling, hearing loss, nosebleeds, postnasal drip, rhinorrhea, sinus pressure, sinus pain, sneezing, sore throat, tinnitus and trouble swallowing.   Eyes:  Negative for pain, discharge, redness, itching and visual disturbance.  Respiratory:  Negative for cough, chest tightness, shortness of breath and wheezing.   Cardiovascular:  Negative for chest pain, palpitations and leg swelling.  Gastrointestinal:  Negative for abdominal distention, abdominal pain, blood in stool, constipation, diarrhea, nausea and vomiting.  Endocrine: Negative for cold intolerance, heat intolerance, polydipsia, polyphagia and polyuria.  Genitourinary:  Negative for difficulty urinating, dysuria, flank pain, frequency and urgency.  Musculoskeletal:  Negative for arthralgias, back pain, gait problem, joint swelling, myalgias, neck pain and neck stiffness.       Generalized weakness.Joint stiffness   Skin:  Negative for color change, pallor, rash and wound.  Neurological:  Negative for dizziness, syncope, speech difficulty, weakness, light-headedness,  numbness and headaches.  Hematological:  Does not bruise/bleed easily.  Psychiatric/Behavioral:  Positive for sleep disturbance. Negative for agitation, behavioral problems, confusion, hallucinations, self-injury and suicidal ideas. The patient is not nervous/anxious.    Immunization History  Administered Date(s)  Administered   [employer] Vaccination 08/01/2019, 08/29/2019   Pertinent  Health Maintenance Due  Topic Date Due   PAP SMEAR-Modifier  Never done   COLONOSCOPY (Pts 45-50yr Insurance coverage will need to be confirmed)  Never done   MAMMOGRAM  02/02/2013   INFLUENZA VACCINE  07/24/2021 (Originally 11/24/2020)   Fall Risk 11/27/2019 05/27/2021  Falls in the past year? - 0  Was there an injury with Fall? - 0  Fall Risk Category Calculator - 0  Fall Risk Category - Low  Patient Fall Risk Level Low fall risk Low fall risk  Patient at Risk for Falls Due to - No Fall Risks  Fall risk Follow up - Falls evaluation completed   Functional Status Survey:    Vitals:   05/28/21 0947 05/28/21 1034 05/28/21 1058  BP: (!) 160/100 (!) 158/110 (!) 140/100  Pulse: (!) 102    Resp: 16    Temp: 97.8 F (36.6 C)    SpO2: 95%    Weight: 181 lb 3.2 oz (82.2 kg)    Height: 5' 6.34" (1.685 m)     Body mass index is 28.95 kg/m. Physical Exam Vitals reviewed.  Constitutional:      General: She is not in acute distress.    Appearance: Normal appearance. She is normal weight. She is not ill-appearing or diaphoretic.  HENT:     Head: Normocephalic.     Right Ear: Tympanic membrane, ear canal and external ear normal. There is no impacted cerumen.     Left Ear: Tympanic membrane, ear canal and external ear normal. There is no impacted cerumen.     Nose: Nose normal. No congestion or rhinorrhea.     Mouth/Throat:     Mouth: Mucous membranes are moist.     Pharynx: Oropharynx is clear. No oropharyngeal exudate or posterior oropharyngeal erythema.  Eyes:     General: No scleral icterus.       Right eye: No discharge.        Left eye: No discharge.     Extraocular Movements: Extraocular movements intact.     Conjunctiva/sclera: Conjunctivae normal.     Pupils: Pupils are equal, round, and reactive to light.  Neck:     Vascular: No carotid bruit.  Cardiovascular:     Rate and Rhythm: Regular  rhythm. Tachycardia present.     Pulses: Normal pulses.     Heart sounds: Normal heart sounds. No murmur heard.   No friction rub. No gallop.  Pulmonary:     Effort: Pulmonary effort is normal. No respiratory distress.     Breath sounds: Normal breath sounds. No wheezing, rhonchi or rales.  Chest:     Chest wall: No tenderness.  Abdominal:     General: Bowel sounds are normal. There is no distension.     Palpations: Abdomen is soft. There is no mass.     Tenderness: There is no abdominal tenderness. There is no right CVA tenderness, left CVA tenderness, guarding or rebound.  Musculoskeletal:        General: No swelling or tenderness. Normal range of motion.     Cervical back: Normal range of motion. No rigidity or tenderness.     Right lower leg: No edema.  Left lower leg: No edema.  Lymphadenopathy:     Cervical: No cervical adenopathy.  Skin:    General: Skin is warm and dry.     Coloration: Skin is not pale.     Findings: No bruising, erythema, lesion or rash.  Neurological:     Mental Status: She is alert and oriented to person, place, and time.     Cranial Nerves: No cranial nerve deficit.     Sensory: No sensory deficit.     Motor: No weakness.     Coordination: Coordination normal.     Gait: Gait normal.  Psychiatric:        Mood and Affect: Mood normal.        Speech: Speech normal.        Behavior: Behavior normal.        Thought Content: Thought content normal.        Judgment: Judgment normal.    Labs reviewed: No results for input(s): NA, K, CL, CO2, GLUCOSE, BUN, CREATININE, CALCIUM, MG, PHOS in the last 8760 hours. No results for input(s): AST, ALT, ALKPHOS, BILITOT, PROT, ALBUMIN in the last 8760 hours. No results for input(s): WBC, NEUTROABS, HGB, HCT, MCV, PLT in the last 8760 hours. Lab Results  Component Value Date   TSH 1.110 03/19/2008   No results found for: HGBA1C No results found for: CHOL, HDL, LDLCALC, LDLDIRECT, TRIG,  CHOLHDL  Significant Diagnostic Results in last 30 days:  No results found.  Assessment/Plan  1. Tachycardia HR 102 b/min reports feelings of fatigue,weak,weight loss and inability to sleep. Right jugular vein pulsating noted on exam.neck fullness noted but no palpable mass noted.concerning for Thyroid disease.will obtain lab work. - start on Metoprolol succinate for HR and blood pressure control.   - EKG 12-Lead indicates sinus Rhythm with HR 91 b/min.No previous EKG for comparison.  - TSH - metoprolol succinate (TOPROL-XL) 25 MG 24 hr tablet; Take 1 tablet (25 mg total) by mouth daily.  Dispense: 90 tablet; Refill: 3 - follow up in one week for evaluation.   2. Uncontrolled hypertension B/p elevated on arrival rechecked still high.clonidine given with much improvement. - EKG 12-Lead indicates SR as above. - start on metoprolol succinate for both blood pressure and Heart rate control - cloNIDine (CATAPRES) tablet 0.1 mg - CBC with Differential/Platelet - TSH - CMP with eGFR([facility]) - Lipid Panel - metoprolol succinate (TOPROL-XL) 25 MG 24 hr tablet; Take 1 tablet (25 mg total) by mouth daily.  Dispense: 90 tablet; Refill: 3 - F/u in 1 week for B/p check.Refer to Ophthalmology if still having any problems with her vision.   3. Encounter to establish care No medical records for review has signed release of medical records from previous PCP then will update records.Has not seen a provider in 10 years. Declined Influenza vaccine.Due for COVID-19 booster vaccine aware to get from the pharmacy. - Tetanus vaccine due will send to pharmacy next visit due to ongoing symptoms. - made aware to get shingles vaccine at the pharmacy too. Recommended lab today. - pap smear in one week   4. Dry eyes, bilateral OTC lubricant   5. Primary insomnia Will rule out Thyroid disease then recommend melatonin    6. Overweight with body mass index (BMI) 25.0-29.9 BMI 28.95   7. Body mass index  28.0-28.9, adult BMI 28.95  - dietary modification and exercise at least three times per week x 30 minutes   8. Tobacco use disorder, continuous Smokes 1/2 pack cigarette  has smoked x 10 yrs   9. Tobacco abuse counseling Smoking cessation advised currently using Nicotine patch to prevent smoking.   Family/ staff Communication: Reviewed plan of care with patient verbalized understanding   Labs/tests ordered:  - CBC with Differential/Platelet - CMP with eGFR([facility]) - TSH - Lipid panel  Next Appointment : 1 week for pap smear and blood pressure follow up   Sandrea Hughs, NP

## 2021-05-29 LAB — CBC WITH DIFFERENTIAL/PLATELET
Absolute Monocytes: 651 cells/uL (ref 200–950)
Basophils Absolute: 97 cells/uL (ref 0–200)
Basophils Relative: 1.1 %
Eosinophils Absolute: 79 cells/uL (ref 15–500)
Eosinophils Relative: 0.9 %
HCT: 44.3 % (ref 35.0–45.0)
Hemoglobin: 14.6 g/dL (ref 11.7–15.5)
Lymphs Abs: 2191 cells/uL (ref 850–3900)
MCH: 31.1 pg (ref 27.0–33.0)
MCHC: 33 g/dL (ref 32.0–36.0)
MCV: 94.5 fL (ref 80.0–100.0)
MPV: 11.4 fL (ref 7.5–12.5)
Monocytes Relative: 7.4 %
Neutro Abs: 5782 cells/uL (ref 1500–7800)
Neutrophils Relative %: 65.7 %
Platelets: 304 10*3/uL (ref 140–400)
RBC: 4.69 10*6/uL (ref 3.80–5.10)
RDW: 12.6 % (ref 11.0–15.0)
Total Lymphocyte: 24.9 %
WBC: 8.8 10*3/uL (ref 3.8–10.8)

## 2021-05-29 LAB — COMPLETE METABOLIC PANEL WITH GFR
AG Ratio: 1.5 (calc) (ref 1.0–2.5)
ALT: 14 U/L (ref 6–29)
AST: 13 U/L (ref 10–35)
Albumin: 4.3 g/dL (ref 3.6–5.1)
Alkaline phosphatase (APISO): 88 U/L (ref 37–153)
BUN: 7 mg/dL (ref 7–25)
CO2: 29 mmol/L (ref 20–32)
Calcium: 10 mg/dL (ref 8.6–10.4)
Chloride: 103 mmol/L (ref 98–110)
Creat: 0.79 mg/dL (ref 0.50–1.03)
Globulin: 2.9 g/dL (calc) (ref 1.9–3.7)
Glucose, Bld: 95 mg/dL (ref 65–99)
Potassium: 4.1 mmol/L (ref 3.5–5.3)
Sodium: 139 mmol/L (ref 135–146)
Total Bilirubin: 0.5 mg/dL (ref 0.2–1.2)
Total Protein: 7.2 g/dL (ref 6.1–8.1)
eGFR: 87 mL/min/{1.73_m2} (ref >=60)

## 2021-05-29 LAB — LIPID PANEL
Cholesterol: 217 mg/dL — ABNORMAL HIGH (ref <200)
HDL: 48 mg/dL — ABNORMAL LOW (ref >=50)
LDL Cholesterol (Calc): 141 mg/dL (calc) — ABNORMAL HIGH
Non-HDL Cholesterol (Calc): 169 mg/dL (calc) — ABNORMAL HIGH (ref <130)
Total CHOL/HDL Ratio: 4.5 (calc) (ref <5.0)
Triglycerides: 151 mg/dL — ABNORMAL HIGH (ref <150)

## 2021-05-29 LAB — TSH: TSH: 1.53 mIU/L (ref 0.40–4.50)

## 2021-06-01 ENCOUNTER — Other Ambulatory Visit: Payer: Self-pay

## 2021-06-01 DIAGNOSIS — I10 Essential (primary) hypertension: Secondary | ICD-10-CM

## 2021-06-04 ENCOUNTER — Encounter: Payer: Self-pay | Admitting: Family

## 2021-06-05 ENCOUNTER — Ambulatory Visit (INDEPENDENT_AMBULATORY_CARE_PROVIDER_SITE_OTHER): Payer: 59 | Admitting: Family

## 2021-06-05 ENCOUNTER — Encounter: Payer: Self-pay | Admitting: Family

## 2021-06-05 ENCOUNTER — Other Ambulatory Visit: Payer: Self-pay

## 2021-06-05 VITALS — BP 148/90 | HR 77 | Temp 97.7°F | Ht 66.0 in | Wt 178.6 lb

## 2021-06-05 DIAGNOSIS — I10 Essential (primary) hypertension: Secondary | ICD-10-CM

## 2021-06-05 DIAGNOSIS — R000 Tachycardia, unspecified: Secondary | ICD-10-CM

## 2021-06-05 DIAGNOSIS — Z124 Encounter for screening for malignant neoplasm of cervix: Secondary | ICD-10-CM

## 2021-06-05 MED ORDER — METOPROLOL TARTRATE 50 MG PO TABS: 50.0000 mg | ORAL_TABLET | Freq: Two times a day (BID) | ORAL | 3 refills | Status: DC

## 2021-06-05 NOTE — Progress Notes (Signed)
Provider: Marlowe Sax FNP-C  Maxie Slovacek, Nelda Bucks, NP  Patient Care Team: Rein Popov, Nelda Bucks, NP as PCP - General (Family Medicine)  Extended Emergency Contact Information Primary Emergency Contact: FOUST,PATRICIA Address: [place], [place] [address] [place] Phone: [phone] Mobile Phone: [phone] Relation: Mother  Code Status: Full Code  Goals of care: Advanced Directive information Advanced Directives 06/04/2021  Does Patient Have a Medical Advance Directive? No  Would patient like information on creating a medical advance directive? No - Patient declined     Chief Complaint  Patient presents with   Follow-up    Blood pressure follow up/ Pap smear.    HPI:  Pt is a 61 y.o. female seen today for an acute visit for evaluation of high blood pressure and pap smear.she denies any acute issues today. No home blood pressure for review.previous B/p was high and still elevated today.denies any headache,dizziness,vision changes,fatigue,chest tightness,palpitation,chest pain or shortness of breath.    Has been taking metoprolol as directed but states insurance does not pay so had to pay 20 dollars.   Past Medical History:  Diagnosis Date   Hypertension    History reviewed. No pertinent surgical history.  No Known Allergies  Outpatient Encounter Medications as of 06/05/2021  Medication Sig   metoprolol succinate (TOPROL-XL) 25 MG 24 hr tablet Take 1 tablet (25 mg total) by mouth daily.   No facility-administered encounter medications on file as of 06/05/2021.    Review of Systems  Constitutional:  Negative for appetite change, chills, fatigue, fever and unexpected weight change.  Eyes:  Negative for pain, discharge, redness, itching and visual disturbance.  Respiratory:  Negative for cough, chest tightness, shortness of breath and wheezing.   Cardiovascular:  Negative for chest pain, palpitations and leg swelling.   Gastrointestinal:  Negative for abdominal distention, abdominal pain, blood in stool, constipation, diarrhea, nausea and vomiting.  Musculoskeletal:  Negative for arthralgias, back pain, gait problem, joint swelling, myalgias, neck pain and neck stiffness.  Skin:  Negative for color change, pallor and rash.  Neurological:  Negative for dizziness, syncope, speech difficulty, weakness, light-headedness, numbness and headaches.   Immunization History  Administered Date(s) Administered   [employer] Vaccination 08/01/2019, 08/29/2019   Pertinent  Health Maintenance Due  Topic Date Due   PAP SMEAR-Modifier  Never done   COLONOSCOPY (Pts 45-41yrs Insurance coverage will need to be confirmed)  Never done   MAMMOGRAM  02/02/2013   INFLUENZA VACCINE  07/24/2021 (Originally 11/24/2020)   Fall Risk 11/27/2019 05/27/2021 06/04/2021  Falls in the past year? - 0 0  Was there an injury with Fall? - 0 0  Fall Risk Category Calculator - 0 0  Fall Risk Category - Low Low  Patient Fall Risk Level Low fall risk Low fall risk Low fall risk  Patient at Risk for Falls Due to - No Fall Risks No Fall Risks  Fall risk Follow up - Falls evaluation completed Falls evaluation completed   Functional Status Survey:    Vitals:   06/05/21 1021  BP: (!) 148/90  Pulse: 77  Temp: 97.7 F (36.5 C)  SpO2: 97%  Weight: 178 lb 9.6 oz (81 kg)  Height: 5\' 6"  (1.676 m)   Body mass index is 28.83 kg/m. Physical Exam Vitals reviewed. Chaperone present: Earvin Hansen.  Constitutional:      General: She is not in acute distress.    Appearance: Normal appearance.  She is normal weight. She is not ill-appearing or diaphoretic.  Eyes:     General: No scleral icterus.       Right eye: No discharge.        Left eye: No discharge.     Extraocular Movements: Extraocular movements intact.     Conjunctiva/sclera: Conjunctivae normal.     Pupils: Pupils are equal, round, and reactive to light.  Neck:     Vascular:  No carotid bruit.  Cardiovascular:     Rate and Rhythm: Normal rate and regular rhythm.     Pulses: Normal pulses.     Heart sounds: Normal heart sounds. No murmur heard.   No friction rub. No gallop.  Pulmonary:     Effort: Pulmonary effort is normal. No respiratory distress.     Breath sounds: Normal breath sounds. No wheezing, rhonchi or rales.  Chest:     Chest wall: No tenderness.  Abdominal:     General: Bowel sounds are normal. There is no distension.     Palpations: Abdomen is soft. There is no mass.     Tenderness: There is no abdominal tenderness. There is no right CVA tenderness, left CVA tenderness, guarding or rebound.     Hernia: There is no hernia in the left inguinal area or right inguinal area.  Genitourinary:    General: Normal vulva.     Exam position: Lithotomy position.     Vagina: Normal.     Cervix: Normal.     Uterus: Normal.      Adnexa: Right adnexa normal.  Musculoskeletal:        General: No swelling or tenderness. Normal range of motion.     Cervical back: Normal range of motion. No rigidity or tenderness.     Right lower leg: No edema.     Left lower leg: No edema.  Lymphadenopathy:     Cervical: No cervical adenopathy.     Lower Body: No right inguinal adenopathy. No left inguinal adenopathy.  Neurological:     Mental Status: She is alert and oriented to person, place, and time.     Cranial Nerves: No cranial nerve deficit.     Sensory: No sensory deficit.     Motor: No weakness.     Coordination: Coordination normal.     Gait: Gait normal.  Psychiatric:        Mood and Affect: Mood normal.        Speech: Speech normal.        Behavior: Behavior normal.    Labs reviewed: Recent Labs    05/28/21 1059  NA 139  K 4.1  CL 103  CO2 29  GLUCOSE 95  BUN 7  CREATININE 0.79  CALCIUM 10.0   Recent Labs    05/28/21 1059  AST 13  ALT 14  BILITOT 0.5  PROT 7.2   Recent Labs    05/28/21 1059  WBC 8.8  NEUTROABS 5,782  HGB 14.6   HCT 44.3  MCV 94.5  PLT 304   Lab Results  Component Value Date   TSH 1.53 05/28/2021   No results found for: HGBA1C Lab Results  Component Value Date   CHOL 217 (H) 05/28/2021   HDL 48 (L) 05/28/2021   LDLCALC 141 (H) 05/28/2021   TRIG 151 (H) 05/28/2021   CHOLHDL 4.5 05/28/2021    Significant Diagnostic Results in last 30 days:  No results found.  Assessment/Plan  1.  Pap smear for cervical cancer screening Vaginal  exam normal.No bleeding or discharge noted.tolerated procedure  Chaperone present Mickey Farber williamson,CMA present during exam.  3. Essential hypertension B/p has improved but not at goal.will increase metoprolol to 50 mg tablet twice daily.states med too expensive at [facility] drug will send to [facility]. -  check Blood pressure at home and record on log provided and notify provider if B/p > 140/90 - follow up in one   Family/ staff Communication: Reviewed plan of care with patient  Labs/tests ordered: None   Next Appointment: 1 month for blood pressure follow up.  Sandrea Hughs, NP

## 2021-06-09 LAB — PAP IG (IMAGE GUIDED)

## 2021-06-11 ENCOUNTER — Other Ambulatory Visit: Payer: Self-pay

## 2021-06-11 MED ORDER — METRONIDAZOLE 500 MG PO TABS: 500.0000 mg | ORAL_TABLET | Freq: Two times a day (BID) | ORAL | 0 refills | Status: AC

## 2021-06-19 ENCOUNTER — Telehealth: Payer: Self-pay | Admitting: *Deleted

## 2021-06-19 MED ORDER — METRONIDAZOLE 500 MG PO TABS: 500.0000 mg | ORAL_TABLET | Freq: Two times a day (BID) | ORAL | 0 refills | Status: DC

## 2021-06-19 NOTE — Telephone Encounter (Signed)
-----   Message from Sandrea Hughs, NP sent at 06/12/2021 11:53 AM EST ----- Noted

## 2021-06-19 NOTE — Telephone Encounter (Signed)
Sandrea Hughs, NP  06/11/2021 12:16 PM EST     Pap smear negative for malignancy. Bacterial vaginosis infection noted.Recommend Metronidazole 500 mg tablet one by mouth twice daily x 7 days      Patient notified and agreed.  Rx sent to Pharmacy as patient requested.

## 2021-07-09 ENCOUNTER — Other Ambulatory Visit: Payer: Self-pay

## 2021-07-09 ENCOUNTER — Encounter: Payer: Self-pay | Admitting: Family

## 2021-07-09 ENCOUNTER — Ambulatory Visit (INDEPENDENT_AMBULATORY_CARE_PROVIDER_SITE_OTHER): Payer: 59 | Admitting: Family

## 2021-07-09 VITALS — BP 140/100 | HR 90 | Temp 97.6°F | Resp 18 | Ht 66.0 in | Wt 184.8 lb

## 2021-07-09 DIAGNOSIS — I10 Essential (primary) hypertension: Secondary | ICD-10-CM | POA: Diagnosis not present

## 2021-07-09 DIAGNOSIS — Z0289 Encounter for other administrative examinations: Secondary | ICD-10-CM

## 2021-07-09 DIAGNOSIS — H539 Unspecified visual disturbance: Secondary | ICD-10-CM | POA: Diagnosis not present

## 2021-07-09 MED ORDER — CLONIDINE HCL 0.1 MG PO TABS
0.1000 mg | ORAL_TABLET | Freq: Once | ORAL | Status: AC
  Administered 2021-07-09: 0.1 mg via ORAL

## 2021-07-09 MED ORDER — AMLODIPINE BESYLATE 5 MG PO TABS: 5.0000 mg | ORAL_TABLET | Freq: Every day | ORAL | 0 refills | Status: DC

## 2021-07-09 NOTE — Progress Notes (Signed)
? ?Provider: Marlowe Sax FNP-C ? ?Dracen Reigle, Nelda Bucks, NP ? ?Patient Care Team: ?Delayni Streed, Nelda Bucks, NP as PCP - General (Family Medicine) ? ?Extended Emergency Contact Information ?Primary Emergency Contact: FOUST,PATRICIA ?Address: [address] ?         [place], [place] [address] [place] ?Home Phone: [phone] ?Mobile Phone: [phone] ?Relation: Mother ? ?Code Status: Full Code  ?Goals of care: Advanced Directive information ?Advanced Directives 07/09/2021  ?Does Patient Have a Medical Advance Directive? No  ?Would patient like information on creating a medical advance directive? No - Patient declined  ? ? ? ?Chief Complaint  ?Patient presents with  ? Medical Management of Chronic Issues  ?  1 month follow up on blood pressure.  ? ? ?HPI:  ?Pt is a 61 y.o. female seen today for an acute visit for 1 month follow-up blood pressure.She was seen on 06/05/2021 blood pressure was 148/90 metoprolol was increased to 50 mg tablet twice daily.She was advised to check blood pressure at home and record and notify provider if it still above 140/90 and to follow-up today for recheck.  States does not check her blood pressure at home. No log for evaluation ?denies any headache,dizziness,vision changes,fatigue,chest tightness,palpitation,chest pain or shortness of breath.    ? ?Smokes 4-5 cigarettes per day. ?Brought in paperwork from [place] disability services, [place] paperwork to be filled out for disability. State unable to stand for up long at work.She denies any pain or any unsteady gait. ? ? ? ?Past Medical History:  ?Diagnosis Date  ? Hypertension   ? ?History reviewed. No pertinent surgical history. ? ?No Known Allergies ? ?Outpatient Encounter Medications as of 07/09/2021  ?Medication Sig  ? metoprolol tartrate (LOPRESSOR) 50 MG tablet Take 1 tablet (50 mg total) by mouth 2 (two) times daily.  ? [DISCONTINUED] metroNIDAZOLE (FLAGYL) 500 MG tablet Take 1 tablet (500 mg total) by mouth 2 (two) times  daily. For 7 days.  ? ?No facility-administered encounter medications on file as of 07/09/2021.  ? ? ?Review of Systems  ?Constitutional:  Negative for appetite change, chills, fatigue, fever and unexpected weight change.  ?HENT:  Positive for dental problem. Negative for congestion, ear discharge, ear pain, facial swelling, hearing loss, nosebleeds, postnasal drip, rhinorrhea, sinus pressure, sinus pain, sneezing, sore throat, tinnitus and trouble swallowing.   ?     Missing teeth   ?Eyes:  Negative for pain, discharge, redness, itching and visual disturbance.  ?Respiratory:  Negative for cough, chest tightness, shortness of breath and wheezing.   ?Cardiovascular:  Negative for chest pain, palpitations and leg swelling.  ?Gastrointestinal:  Negative for abdominal distention, abdominal pain, constipation, nausea and vomiting.  ?Genitourinary:  Negative for difficulty urinating, dysuria, flank pain, frequency and urgency.  ?Musculoskeletal:  Negative for arthralgias, back pain, gait problem, joint swelling, myalgias, neck pain and neck stiffness.  ?Skin:  Negative for color change, pallor, rash and wound.  ?Neurological:  Negative for dizziness, syncope, speech difficulty, weakness, light-headedness, numbness and headaches.  ?Hematological:  Does not bruise/bleed easily.  ?Psychiatric/Behavioral:  Negative for agitation, behavioral problems, confusion, hallucinations, self-injury, sleep disturbance and suicidal ideas. The patient is not nervous/anxious.   ? ?Immunization History  ?Administered Date(s) Administered  ? Moderna Sars-Covid-2 Vaccination 08/01/2019, 08/29/2019  ? ?Pertinent  Health Maintenance Due  ?Topic Date Due  ? PAP SMEAR-Modifier  Never done  ? COLONOSCOPY (Pts 45-10yr Insurance coverage will need to be confirmed)  Never done  ? MAMMOGRAM  02/02/2013  ? INFLUENZA  VACCINE  07/24/2021 (Originally 11/24/2020)  ? ?Fall Risk 11/27/2019 05/27/2021 06/04/2021 07/09/2021  ?Falls in the past year? - 0 0 0  ?Was there  an injury with Fall? - 0 0 0  ?Fall Risk Category Calculator - 0 0 0  ?Fall Risk Category - Low Low Low  ?Patient Fall Risk Level Low fall risk Low fall risk Low fall risk Low fall risk  ?Patient at Risk for Falls Due to - No Fall Risks No Fall Risks No Fall Risks  ?Fall risk Follow up - Falls evaluation completed Falls evaluation completed Falls evaluation completed  ? ?Functional Status Survey: ?  ? ?Vitals:  ? 07/09/21 1036  ?BP: (!) 150/102  ?Pulse: 90  ?Resp: 18  ?Temp: 97.6 ?F (36.4 ?C)  ?SpO2: 98%  ?Weight: 184 lb 12.8 oz (83.8 kg)  ?Height: '5\' 6"'$  (1.676 m)  ? ?Body mass index is 29.83 kg/m?Marland Kitchen ?Physical Exam ?Vitals reviewed.  ?Constitutional:   ?   General: She is not in acute distress. ?   Appearance: Normal appearance. She is normal weight. She is not ill-appearing or diaphoretic.  ?HENT:  ?   Head: Normocephalic.  ?   Right Ear: Tympanic membrane, ear canal and external ear normal. There is no impacted cerumen.  ?   Left Ear: Tympanic membrane, ear canal and external ear normal. There is no impacted cerumen.  ?   Nose: Nose normal. No congestion or rhinorrhea.  ?   Mouth/Throat:  ?   Mouth: Mucous membranes are moist.  ?   Pharynx: Oropharynx is clear. No oropharyngeal exudate or posterior oropharyngeal erythema.  ?Eyes:  ?   General: No scleral icterus.    ?   Right eye: No discharge.     ?   Left eye: No discharge.  ?   Extraocular Movements: Extraocular movements intact.  ?   Conjunctiva/sclera: Conjunctivae normal.  ?   Pupils: Pupils are equal, round, and reactive to light.  ?Neck:  ?   Vascular: No carotid bruit.  ?Cardiovascular:  ?   Rate and Rhythm: Normal rate and regular rhythm.  ?   Pulses: Normal pulses.  ?   Heart sounds: Normal heart sounds. No murmur heard. ?  No friction rub. No gallop.  ?Pulmonary:  ?   Effort: Pulmonary effort is normal. No respiratory distress.  ?   Breath sounds: Normal breath sounds. No wheezing, rhonchi or rales.  ?Chest:  ?   Chest wall: No tenderness.  ?Abdominal:   ?   General: Bowel sounds are normal. There is no distension.  ?   Palpations: Abdomen is soft. There is no mass.  ?   Tenderness: There is no abdominal tenderness. There is no right CVA tenderness, left CVA tenderness, guarding or rebound.  ?Musculoskeletal:     ?   General: No swelling or tenderness. Normal range of motion.  ?   Cervical back: Normal range of motion. No rigidity or tenderness.  ?   Right lower leg: No edema.  ?   Left lower leg: No edema.  ?Lymphadenopathy:  ?   Cervical: No cervical adenopathy.  ?Skin: ?   General: Skin is warm and dry.  ?   Coloration: Skin is not pale.  ?   Findings: No bruising, erythema, lesion or rash.  ?Neurological:  ?   Mental Status: She is alert and oriented to person, place, and time.  ?   Cranial Nerves: No cranial nerve deficit.  ?   Sensory: No sensory  deficit.  ?   Motor: No weakness.  ?   Coordination: Coordination normal.  ?   Gait: Gait normal.  ?Psychiatric:     ?   Mood and Affect: Mood normal.     ?   Speech: Speech normal.     ?   Behavior: Behavior normal.     ?   Thought Content: Thought content normal.     ?   Judgment: Judgment normal.  ? ? ?Labs reviewed: ?Recent Labs  ?  05/28/21 ?1059  ?NA 139  ?K 4.1  ?CL 103  ?CO2 29  ?GLUCOSE 95  ?BUN 7  ?CREATININE 0.79  ?CALCIUM 10.0  ? ?Recent Labs  ?  05/28/21 ?1059  ?AST 13  ?ALT 14  ?BILITOT 0.5  ?PROT 7.2  ? ?Recent Labs  ?  05/28/21 ?1059  ?WBC 8.8  ?NEUTROABS 5,782  ?HGB 14.6  ?HCT 44.3  ?MCV 94.5  ?PLT 304  ? ?Lab Results  ?Component Value Date  ? TSH 1.53 05/28/2021  ? ?No results found for: HGBA1C ?Lab Results  ?Component Value Date  ? CHOL 217 (H) 05/28/2021  ? HDL 48 (L) 05/28/2021  ? LDLCALC 141 (H) 05/28/2021  ? TRIG 151 (H) 05/28/2021  ? CHOLHDL 4.5 05/28/2021  ? ? ?Significant Diagnostic Results in last 30 days:  ?No results found. ? ?Assessment/Plan ?1. Uncontrolled hypertension ?Blood pressure uncontrolled though did not take her blood pressure medication today.  Does not check her blood  pressure at home ?-Continue on Metroprolol ?-We will start on amlodipine 5 mg tablet daily side effects discussed ?Clonidine administered during visit much improvement. ?-  amLODipine (NORVASC) 5 MG tablet;

## 2021-07-24 ENCOUNTER — Encounter: Payer: Self-pay | Admitting: Family

## 2021-07-24 ENCOUNTER — Ambulatory Visit (INDEPENDENT_AMBULATORY_CARE_PROVIDER_SITE_OTHER): Payer: 59 | Admitting: Family

## 2021-07-24 VITALS — BP 124/98 | HR 66 | Temp 97.1°F | Resp 16 | Ht 66.0 in | Wt 184.0 lb

## 2021-07-24 DIAGNOSIS — I10 Essential (primary) hypertension: Secondary | ICD-10-CM | POA: Insufficient documentation

## 2021-07-24 DIAGNOSIS — Z23 Encounter for immunization: Secondary | ICD-10-CM | POA: Diagnosis not present

## 2021-07-24 DIAGNOSIS — Z1159 Encounter for screening for other viral diseases: Secondary | ICD-10-CM

## 2021-07-24 DIAGNOSIS — Z1231 Encounter for screening mammogram for malignant neoplasm of breast: Secondary | ICD-10-CM

## 2021-07-24 DIAGNOSIS — Z113 Encounter for screening for infections with a predominantly sexual mode of transmission: Secondary | ICD-10-CM

## 2021-07-24 DIAGNOSIS — Z1211 Encounter for screening for malignant neoplasm of colon: Secondary | ICD-10-CM | POA: Diagnosis not present

## 2021-07-24 MED ORDER — TETANUS-DIPHTH-ACELL PERTUSSIS 5-2.5-18.5 LF-MCG/0.5 IM SUSP: 0.5000 mL | Freq: Once | INTRAMUSCULAR | 0 refills | Status: AC

## 2021-07-24 NOTE — Patient Instructions (Signed)
-   Please get Tetanus and shingles vaccine at your pharmacy  ?

## 2021-07-24 NOTE — Progress Notes (Signed)
? ?Provider: Marlowe Sax FNP-C ? ?Monta Police, Nelda Bucks, NP ? ?Patient Care Team: ?Toshiyuki Fredell, Nelda Bucks, NP as PCP - General (Family Medicine) ? ?Extended Emergency Contact Information ?Primary Emergency Contact: Bradley,Pamela ?Address: [address] ?         [place], [place] [address] [place] ?Mobile Phone: [phone] ?Relation: Daughter ?Secondary Emergency Contact: FOUST,PATRICIA ?Address: [address] ?         [place], [place] [address] [place] ?Home Phone: [phone] ?Mobile Phone: [phone] ?Relation: Mother ? ?Code Status:  Full Code  ?Goals of care: Advanced Directive information ? ?  07/24/2021  ?  9:49 AM  ?Advanced Directives  ?Does Patient Have a Medical Advance Directive? No  ?Would patient like information on creating a medical advance directive? No - Patient declined  ? ? ? ?Chief Complaint  ?Patient presents with  ? Follow-up  ?  2 week blood pressure check.   ? ? ?HPI:  ?Pt is a 61 y.o. female seen today for an acute visit for evaluation of 2 week blood pressure check.  She was seen on 07/09/2021 blood pressure was 140/100 she was started on amlodipine 5 mg daily. She states has been taking blood pressure medication as directed.  Has blood pressure cuff to check her blood pressure at home.  Blood pressure today has improved..denies any headache,dizziness,vision changes,fatigue,chest tightness,palpitation,chest pain or shortness of breath.  ?   ?Health maintenance: ?Due for screening colonoscopy.  She denies any abdominal issues or any blood in the stool.   ? ?Also due for screening mammogram.  Denies any acute issues with her breast. ? ?Immunization reviewed due for tetanus and shingles vaccines.  Has had her third COVID-vaccine given on 06/16/2021 she brought her card in today.  Advised to continue to follow-up at the pharmacy for the rest of the booster vaccines ?Will obtain lab work for HIV and hepatitis screening. ? ?Past Medical History:  ?Diagnosis Date  ?  Hypertension   ? ?History reviewed. No pertinent surgical history. ? ?No Known Allergies ? ?Outpatient Encounter Medications as of 07/24/2021  ?Medication Sig  ? amLODipine (NORVASC) 5 MG tablet Take 1 tablet (5 mg total) by mouth daily.  ? metoprolol tartrate (LOPRESSOR) 50 MG tablet Take 1 tablet (50 mg total) by mouth 2 (two) times daily.  ? ?No facility-administered encounter medications on file as of 07/24/2021.  ? ? ?Review of Systems  ?Constitutional:  Negative for appetite change, chills, fatigue, fever and unexpected weight change.  ?Eyes:  Negative for pain, discharge, redness, itching and visual disturbance.  ?Respiratory:  Negative for cough, chest tightness, shortness of breath and wheezing.   ?Cardiovascular:  Negative for chest pain, palpitations and leg swelling.  ?Gastrointestinal:  Negative for abdominal distention, abdominal pain, blood in stool, constipation, diarrhea, nausea and vomiting.  ?Musculoskeletal:  Negative for arthralgias, back pain, gait problem, joint swelling, myalgias, neck pain and neck stiffness.  ?Skin:  Negative for color change, pallor, rash and wound.  ?Neurological:  Negative for dizziness, syncope, weakness, light-headedness, numbness and headaches.  ?Hematological:  Does not bruise/bleed easily.  ?Psychiatric/Behavioral:  Negative for agitation, behavioral problems and sleep disturbance. The patient is not nervous/anxious.   ? ?Immunization History  ?Administered Date(s) Administered  ? Moderna Sars-Covid-2 Vaccination 08/01/2019, 08/29/2019  ? ?Pertinent  Health Maintenance Due  ?Topic Date Due  ? PAP SMEAR-Modifier  Never done  ? COLONOSCOPY (Pts 45-50yr Insurance coverage will need to be confirmed)  Never done  ?  MAMMOGRAM  02/02/2013  ? INFLUENZA VACCINE  07/24/2021 (Originally 11/24/2020)  ? ? ?  11/27/2019  ?  5:46 PM 05/27/2021  ?  4:53 PM 06/04/2021  ?  4:30 PM 07/09/2021  ? 10:28 AM 07/24/2021  ?  9:49 AM  ?Fall Risk  ?Falls in the past year?  0 0 0 1  ?Was there an injury  with Fall?  0 0 0 1  ?Fall Risk Category Calculator  0 0 0 2  ?Fall Risk Category  Low Low Low Moderate  ?Patient Fall Risk Level Low fall risk Low fall risk Low fall risk Low fall risk Moderate fall risk  ?Patient at Risk for Falls Due to  No Fall Risks No Fall Risks No Fall Risks History of fall(s)  ?Fall risk Follow up  Falls evaluation completed Falls evaluation completed Falls evaluation completed Falls evaluation completed;Education provided;Falls prevention discussed  ? ?Functional Status Survey: ?  ? ?Vitals:  ? 07/24/21 1000  ?BP: (!) 124/98  ?Pulse: 66  ?Resp: 16  ?Temp: (!) 97.1 ?F (36.2 ?C)  ?SpO2: 94%  ?Weight: 184 lb (83.5 kg)  ?Height: _0  (1.676 m)  ? ?Body mass index is 29.7 kg/m?Marland Kitchen ?Physical Exam ?Vitals reviewed.  ?Constitutional:   ?   General: She is not in acute distress. ?   Appearance: Normal appearance. She is overweight. She is not ill-appearing or diaphoretic.  ?HENT:  ?   Head: Normocephalic.  ?   Nose: Nose normal. No congestion or rhinorrhea.  ?   Mouth/Throat:  ?   Mouth: Mucous membranes are moist.  ?   Pharynx: Oropharynx is clear. No oropharyngeal exudate or posterior oropharyngeal erythema.  ?Eyes:  ?   General: No scleral icterus.    ?   Right eye: No discharge.     ?   Left eye: No discharge.  ?   Conjunctiva/sclera: Conjunctivae normal.  ?   Pupils: Pupils are equal, round, and reactive to light.  ?Neck:  ?   Vascular: No carotid bruit.  ?Cardiovascular:  ?   Rate and Rhythm: Normal rate and regular rhythm.  ?   Pulses: Normal pulses.  ?   Heart sounds: Normal heart sounds. No murmur heard. ?  No friction rub. No gallop.  ?Pulmonary:  ?   Effort: Pulmonary effort is normal. No respiratory distress.  ?   Breath sounds: Normal breath sounds. No wheezing, rhonchi or rales.  ?Chest:  ?   Chest wall: No tenderness.  ?Abdominal:  ?   General: Bowel sounds are normal. There is no distension.  ?   Palpations: Abdomen is soft. There is no mass.  ?   Tenderness: There is no abdominal  tenderness. There is no right CVA tenderness, left CVA tenderness, guarding or rebound.  ?Musculoskeletal:     ?   General: No swelling or tenderness. Normal range of motion.  ?   Cervical back: Normal range of motion. No rigidity or tenderness.  ?   Right lower leg: No edema.  ?   Left lower leg: No edema.  ?Lymphadenopathy:  ?   Cervical: No cervical adenopathy.  ?Skin: ?   General: Skin is warm and dry.  ?   Coloration: Skin is not pale.  ?   Findings: No erythema or rash.  ?Neurological:  ?   Mental Status: She is alert and oriented to person, place, and time.  ?   Motor: No weakness.  ?   Gait: Gait normal.  ?Psychiatric:     ?  Mood and Affect: Mood normal.     ?   Speech: Speech normal.     ?   Behavior: Behavior normal.  ? ? ?Labs reviewed: ?Recent Labs  ?  05/28/21 ?1059  ?NA 139  ?K 4.1  ?CL 103  ?CO2 29  ?GLUCOSE 95  ?BUN 7  ?CREATININE 0.79  ?CALCIUM 10.0  ? ?Recent Labs  ?  05/28/21 ?1059  ?AST 13  ?ALT 14  ?BILITOT 0.5  ?PROT 7.2  ? ?Recent Labs  ?  05/28/21 ?1059  ?WBC 8.8  ?NEUTROABS 5,782  ?HGB 14.6  ?HCT 44.3  ?MCV 94.5  ?PLT 304  ? ?Lab Results  ?Component Value Date  ? TSH 1.53 05/28/2021  ? ?No results found for: HGBA1C ?Lab Results  ?Component Value Date  ? CHOL 217 (H) 05/28/2021  ? HDL 48 (L) 05/28/2021  ? LDLCALC 141 (H) 05/28/2021  ? TRIG 151 (H) 05/28/2021  ? CHOLHDL 4.5 05/28/2021  ? ? ?Significant Diagnostic Results in last 30 days:  ?No results found. ? ?Assessment/Plan ?1. Essential hypertension ?Blood pressure has improved this visit.  No home readings for comparison.  ASymptomatic ?-Continue on amlodipine 5 mg tablet and metoprolol titrated 50 mg twice daily. ?-Will increase amlodipine 5 mg to 10 if blood pressure still not under control. ?- CBC with Differential/Platelet; Future ?- CMP with eGFR([facility]); Future ? ?2. Need for Tdap vaccination ?Advised to get Tdap vaccine at the pharmacy. Script send to pharmacy today ?- Tdap (Vina) 5-2.5-18.5 LF-MCG/0.5 injection; Inject 0.5 mLs  into the muscle once for 1 dose.  Dispense: 0.5 mL; Refill: 0 ? ?3. Colon cancer screening ?Asymptomatic ?- Ambulatory referral to Gastroenterology ? ?4. Breast cancer screening by mammogram ?Reports no sym

## 2021-07-27 ENCOUNTER — Encounter: Payer: Self-pay | Admitting: Gastroenterology

## 2021-07-27 LAB — HEPATITIS C ANTIBODY
Hepatitis C Ab: NONREACTIVE
SIGNAL TO CUT-OFF: 0.02 (ref <1.00)

## 2021-07-27 LAB — HIV ANTIBODY (ROUTINE TESTING W REFLEX): HIV 1&2 Ab, 4th Generation: NONREACTIVE

## 2021-07-28 ENCOUNTER — Other Ambulatory Visit: Payer: Self-pay

## 2021-07-28 DIAGNOSIS — I10 Essential (primary) hypertension: Secondary | ICD-10-CM

## 2021-07-28 MED ORDER — AMLODIPINE BESYLATE 5 MG PO TABS: 5.0000 mg | ORAL_TABLET | Freq: Every day | ORAL | 1 refills | Status: DC

## 2021-07-28 MED ORDER — METOPROLOL TARTRATE 50 MG PO TABS: 50.0000 mg | ORAL_TABLET | Freq: Two times a day (BID) | ORAL | 1 refills | Status: DC

## 2021-07-28 NOTE — Telephone Encounter (Signed)
Patient called requesting refill on both amlodipine and metoprolol. Patient would like  rx's sent to [facility] for a 90 day supply. ? ?RX's sent as requested ? ?Patient asked that [facility] be deleted from her profile, deleted as requested.  ?

## 2021-07-29 ENCOUNTER — Ambulatory Visit (AMBULATORY_SURGERY_CENTER): Payer: 59

## 2021-07-29 ENCOUNTER — Other Ambulatory Visit: Payer: Self-pay

## 2021-07-29 VITALS — Ht 65.0 in | Wt 180.0 lb

## 2021-07-29 DIAGNOSIS — Z1211 Encounter for screening for malignant neoplasm of colon: Secondary | ICD-10-CM

## 2021-07-29 MED ORDER — PEG 3350-KCL-NA BICARB-NACL 420 G PO SOLR: 4000.0000 mL | Freq: Once | ORAL | 0 refills | Status: AC

## 2021-07-29 NOTE — Progress Notes (Signed)
No egg or soy allergy known to patient  ?No issues known to pt with past sedation with any surgeries or procedures ?Patient denies ever being told they had issues or difficulty with intubation  ?No FH of Malignant Hyperthermia ?Pt is not on diet pills ?Pt is not on home 02  ?Pt is not on blood thinners  ?Pt reports issues with constipation- states she gets relief from "straining to get it out" and "I sometimes take Dulcolax to help me too"; patient advised to increase, activity, can take OTC constipation meds; ?No A fib or A flutter ?NO PA's for preps discussed with pt in PV today  ?Discussed with pt there will be an out-of-pocket cost for prep and that varies from $0 to 70 + dollars - pt verbalized understanding  ?Due to the COVID-19 pandemic we are asking patients to follow certain guidelines in PV and the [facility]   ?Pt aware of COVID protocols and [facility] guidelines  ?PV completed over the phone. Pt verified name, DOB, address and insurance during PV today.  ?Pt mailed instruction packet with copy of consent form to read and not return, and instructions.  ?Pt encouraged to call with questions or issues.  ?If pt has My chart, procedure instructions sent via My Chart  ? ?

## 2021-08-06 ENCOUNTER — Encounter: Payer: Self-pay | Admitting: Gastroenterology

## 2021-08-14 ENCOUNTER — Encounter: Payer: Self-pay | Admitting: Gastroenterology

## 2021-08-14 ENCOUNTER — Ambulatory Visit: Payer: 59 | Admitting: Gastroenterology

## 2021-08-14 VITALS — BP 139/97 | HR 116 | Temp 96.9°F | Ht 65.0 in | Wt 180.0 lb

## 2021-08-14 DIAGNOSIS — Z1211 Encounter for screening for malignant neoplasm of colon: Secondary | ICD-10-CM

## 2021-08-14 MED ORDER — FLEET ENEMA 7-19 GM/118ML RE ENEM
1.0000 | ENEMA | Freq: Once | RECTAL | Status: AC
  Administered 2021-08-14: 1 via RECTAL

## 2021-08-14 MED ORDER — SODIUM CHLORIDE 0.9 % IV SOLN: 500.0000 mL | Freq: Once | INTRAVENOUS | Status: DC

## 2021-08-14 NOTE — Progress Notes (Signed)
Patient stated she ate sandwich last night and first said she didn't finish the prep but then said she did. Patient was given an enema and she still had solid pieces of poop in toilet.  After speaking with Dr. Bryan Lemma patient will need to be rescheduled.  Patient stated she will call back to reschedule.  ?

## 2021-08-28 ENCOUNTER — Ambulatory Visit (AMBULATORY_SURGERY_CENTER): Payer: Self-pay | Admitting: *Deleted

## 2021-08-28 VITALS — Ht 66.0 in | Wt 184.2 lb

## 2021-08-28 DIAGNOSIS — Z1211 Encounter for screening for malignant neoplasm of colon: Secondary | ICD-10-CM

## 2021-08-28 NOTE — Progress Notes (Signed)
No egg or soy allergy known to patient  ?No issues known to pt with past sedation with any surgeries or procedures ?Patient denies ever being told they had issues or difficulty with intubation  ?No FH of Malignant Hyperthermia ?Pt is not on diet pills ?Pt is not on  home 02  ?Pt is not on blood thinners  ?Pt denies issues with constipation  ?No A fib or A flutter ? ? Coupon to pt in PV today , Code to Pharmacy and  NO PA's for preps discussed with pt In PV today  ?Discussed with pt there will be an out-of-pocket cost for prep and that varies from $0 to 70 +  dollars - pt verbalized understanding  ?Pt instructed to use Singlecare.com or GoodRx for a price reduction on prep  ? ?PV completed over the phone. Pt verified name, DOB, address and insurance during PV today.  ?Pt mailed instruction packet with copy of consent form to read and not return, and instructions.  ?Pt encouraged to call with questions or issues.  ?If pt has My chart, procedure instructions sent via My Chart  ?Insurance confirmed with pt at [facility] today   ?2 day prep instructions given,stressed to pt.to remain on clear liquids and not to eat solid foods several times during pre-visit,verbalized understanding. ?Plenvu samples given. ?

## 2021-09-11 ENCOUNTER — Encounter: Payer: Self-pay | Admitting: Certified Registered Nurse Anesthetist

## 2021-09-18 ENCOUNTER — Ambulatory Visit (AMBULATORY_SURGERY_CENTER): Payer: 59 | Admitting: Gastroenterology

## 2021-09-18 ENCOUNTER — Encounter: Payer: Self-pay | Admitting: Gastroenterology

## 2021-09-18 ENCOUNTER — Telehealth: Payer: Self-pay

## 2021-09-18 VITALS — BP 134/86 | HR 83 | Temp 97.5°F | Resp 14 | Ht 66.0 in | Wt 184.2 lb

## 2021-09-18 DIAGNOSIS — Z1211 Encounter for screening for malignant neoplasm of colon: Secondary | ICD-10-CM | POA: Diagnosis present

## 2021-09-18 DIAGNOSIS — D175 Benign lipomatous neoplasm of intra-abdominal organs: Secondary | ICD-10-CM

## 2021-09-18 DIAGNOSIS — K5730 Diverticulosis of large intestine without perforation or abscess without bleeding: Secondary | ICD-10-CM

## 2021-09-18 MED ORDER — SODIUM CHLORIDE 0.9 % IV SOLN: 500.0000 mL | Freq: Once | INTRAVENOUS | Status: DC

## 2021-09-18 NOTE — Progress Notes (Signed)
Pt's states no medical or surgical changes since previsit or office visit. 

## 2021-09-18 NOTE — Progress Notes (Signed)
Report given to PACU, vss 

## 2021-09-18 NOTE — Op Note (Signed)
[facility] Patient Name: Michele George Procedure Date: 09/18/2021 11:06 AM MRN: [id number] Endoscopist: Gerrit Heck , MD Age: 61 Referring MD:  Date of Birth: 09/26/1960 Gender: Female Account #: [id number] Procedure:                Colonoscopy Indications:              Screening for colorectal malignant neoplasm, This                            is the patient's first colonoscopy Medicines:                Monitored Anesthesia Care Procedure:                Pre-Anesthesia Assessment:                           - Prior to the procedure, a History and Physical                            was performed, and patient medications and                            allergies were reviewed. The patient's tolerance of                            previous anesthesia was also reviewed. The risks                            and benefits of the procedure and the sedation                            options and risks were discussed with the patient.                            All questions were answered, and informed consent                            was obtained. Prior Anticoagulants: The patient has                            taken no previous anticoagulant or antiplatelet                            agents. ASA Grade Assessment: II - A patient with                            mild systemic disease. After reviewing the risks                            and benefits, the patient was deemed in                            satisfactory condition to undergo the procedure.  After obtaining informed consent, the colonoscope                            was passed under direct vision. Throughout the                            procedure, the patient's blood pressure, pulse, and                            oxygen saturations were monitored continuously. The                            CF [id number] #[id number] was introduced through the anus                            and advanced to the  the terminal ileum. The                            colonoscopy was performed without difficulty. The                            patient tolerated the procedure well. The quality                            of the bowel preparation was good. The terminal                            ileum, ileocecal valve, appendiceal orifice, and                            rectum were photographed. Scope In: 11:16:00 AM Scope Out: 11:29:25 AM Scope Withdrawal Time: 0 hours 10 minutes 40 seconds  Total Procedure Duration: 0 hours 13 minutes 25 seconds  Findings:                 The perianal and digital rectal examinations were                            normal.                           There was a small lipoma, in the descending colon.                           A few small-mouthed diverticula were found in the                            sigmoid colon and descending colon.                           The exam was otherwise normal throughout the                            remainder of the colon.  The retroflexed view of the distal rectum and anal                            verge was normal and showed no anal or rectal                            abnormalities.                           The terminal ileum appeared normal. Complications:            No immediate complications. Estimated Blood Loss:     Estimated blood loss: none. Impression:               - Small lipoma in the descending colon.                           - Diverticulosis in the sigmoid colon and in the                            descending colon.                           - The distal rectum and anal verge are normal on                            retroflexion view.                           - The examined portion of the ileum was normal.                           - No specimens collected. Recommendation:           - Patient has a contact number available for                            emergencies. The signs and symptoms of  potential                            delayed complications were discussed with the                            patient. Return to normal activities tomorrow.                            Written discharge instructions were provided to the                            patient.                           - Resume previous diet.                           - Continue present medications.                           -  Repeat colonoscopy in 10 years for screening                            purposes.                           - Return to GI office PRN. Gerrit Heck, MD 09/18/2021 11:33:14 AM

## 2021-09-18 NOTE — Patient Instructions (Signed)
Handout given for diverticulosis.  Resume previous diet and continue current medications.  Repeat colonoscopy in 10 years.  YOU HAD AN ENDOSCOPIC PROCEDURE TODAY AT [facility]:   Refer to the procedure report that was given to you for any specific questions about what was found during the examination.  If the procedure report does not answer your questions, please call your gastroenterologist to clarify.  If you requested that your care partner not be given the details of your procedure findings, then the procedure report has been included in a sealed envelope for you to review at your convenience later.  YOU SHOULD EXPECT: Some feelings of bloating in the abdomen. Passage of more gas than usual.  Walking can help get rid of the air that was put into your GI tract during the procedure and reduce the bloating. If you had a lower endoscopy (such as a colonoscopy or flexible sigmoidoscopy) you may notice spotting of blood in your stool or on the toilet paper. If you underwent a bowel prep for your procedure, you may not have a normal bowel movement for a few days.  Please Note:  You might notice some irritation and congestion in your nose or some drainage.  This is from the oxygen used during your procedure.  There is no need for concern and it should clear up in a day or so.  SYMPTOMS TO REPORT IMMEDIATELY:  Following lower endoscopy (colonoscopy or flexible sigmoidoscopy):  Excessive amounts of blood in the stool  Significant tenderness or worsening of abdominal pains  Swelling of the abdomen that is new, acute  Fever of 100F or higher  For urgent or emergent issues, a gastroenterologist can be reached at any hour by calling [phone]. Do not use MyChart messaging for urgent concerns.    DIET:  We do recommend a small meal at first, but then you may proceed to your regular diet.  Drink plenty of fluids but you should avoid alcoholic beverages for 24 hours.  ACTIVITY:  You  should plan to take it easy for the rest of today and you should NOT DRIVE or use heavy machinery until tomorrow (because of the sedation medicines used during the test).    FOLLOW UP: Our staff will call the number listed on your records 48-72 hours following your procedure to check on you and address any questions or concerns that you may have regarding the information given to you following your procedure. If we do not reach you, we will leave a message.  We will attempt to reach you two times.  During this call, we will ask if you have developed any symptoms of COVID 19. If you develop any symptoms (ie: fever, flu-like symptoms, shortness of breath, cough etc.) before then, please call [phone].  If you test positive for Covid 19 in the 2 weeks post procedure, please call and report this information to [place].    If any biopsies were taken you will be contacted by phone or by letter within the next 1-3 weeks.  Please call us at [phone] if you have not heard about the biopsies in 3 weeks.    SIGNATURES/CONFIDENTIALITY: You and/or your care partner have signed paperwork which will be entered into your electronic medical record.  These signatures attest to the fact that that the information above on your After Visit Summary has been reviewed and is understood.  Full responsibility of the confidentiality of this discharge information lies with you and/or your care-partner.

## 2021-09-18 NOTE — Progress Notes (Signed)
1111 BP 151/100, Labetalol given IV, MD update, vss

## 2021-09-18 NOTE — Progress Notes (Signed)
GASTROENTEROLOGY PROCEDURE H&P NOTE   Primary Care Physician: Ngetich, Nelda Bucks, NP    Reason for Procedure:  Colon Cancer screening  Plan:    Colonoscopy  Patient is appropriate for endoscopic procedure(s) in the ambulatory ([facility]) setting.  The nature of the procedure, as well as the risks, benefits, and alternatives were carefully and thoroughly reviewed with the patient. Ample time for discussion and questions allowed. The patient understood, was satisfied, and agreed to proceed.     HPI: Michele George is a 61 y.o. female who presents for colonoscopy for routine Colon Cancer screening.  No active GI symptoms.  No known family history of colon cancer or related malignancy.  Patient is otherwise without complaints or active issues today.  Past Medical History:  Diagnosis Date   Hypertension     Past Surgical History:  Procedure Laterality Date   COLONOSCOPY     TOTAL ABDOMINAL HYSTERECTOMY  2012    Prior to Admission medications   Medication Sig Start Date End Date Taking? Authorizing Provider  amLODipine (NORVASC) 5 MG tablet Take 1 tablet (5 mg total) by mouth daily. 07/28/21  Yes Ngetich, Dinah C, NP  metoprolol tartrate (LOPRESSOR) 50 MG tablet Take 1 tablet (50 mg total) by mouth 2 (two) times daily. 07/28/21 09/18/21 Yes Ngetich, Dinah C, NP    Current Outpatient Medications  Medication Sig Dispense Refill   amLODipine (NORVASC) 5 MG tablet Take 1 tablet (5 mg total) by mouth daily. 90 tablet 1   metoprolol tartrate (LOPRESSOR) 50 MG tablet Take 1 tablet (50 mg total) by mouth 2 (two) times daily. 180 tablet 1   Current Facility-Administered Medications  Medication Dose Route Frequency Provider Last Rate Last Admin   0.9 %  sodium chloride infusion  500 mL Intravenous Once Stephenia Vogan V, DO       0.9 %  sodium chloride infusion  500 mL Intravenous Once Nicholis Stepanek V, DO        Allergies as of 09/18/2021   (No Known Allergies)    Family History   Problem Relation Age of Onset   Diabetes Mother    Colon polyps Neg Hx    Colon cancer Neg Hx    Esophageal cancer Neg Hx    Rectal cancer Neg Hx    Stomach cancer Neg Hx    Crohn's disease Neg Hx     Social History   Socioeconomic History   Marital status: Single    Spouse name: Not on file   Number of children: Not on file   Years of education: Not on file   Highest education level: Not on file  Occupational History   Not on file  Tobacco Use   Smoking status: Every Day    Packs/day: 0.50    Years: 10.00    Pack years: 5.00    Types: Cigarettes   Smokeless tobacco: Never  Vaping Use   Vaping Use: Never used  Substance and Sexual Activity   Alcohol use: Never   Drug use: Never   Sexual activity: Not on file  Other Topics Concern   Not on file  Social History Narrative   Tobacco use, amount per day now:   Past tobacco use, amount per day: 1/2 pack per day.   How many years did you use tobacco: 10 years    Alcohol use (drinks per week): None   Diet:   Do you drink/eat things with caffeine: Coffee.   Marital status:  Not  What year were you married?   Do you live in a house, apartment, assisted living, condo, trailer, etc.? Housing   Is it one or more stories? Two   How many persons live in your home? Self   Do you have pets in your home?( please list) No   Highest Level of education completed? GED   Current or past profession:   Do you exercise?    No                              Type and how often?   Do you have a living will? No   Do you have a DNR form?    No                               If not, do you want to discuss one?   Do you have signed POA/HPOA forms? No                       If so, please bring to you appointment      Do you have any difficulty bathing or dressing yourself? No   Do you have any difficulty preparing food or eating? No   Do you have any difficulty managing your medications? No   Do you have any  difficulty managing your finances? No   Do you have any difficulty affording your medications? No   Social Determinants of Radio broadcast assistant Strain: Not on file  Food Insecurity: Not on file  Transportation Needs: Not on file  Physical Activity: Not on file  Stress: Not on file  Social Connections: Not on file  Intimate Partner Violence: Not on file    Physical Exam: Vital signs in last 24 hours: '@BP'$  (!) 145/110   Pulse (!) 117   Temp (!) 97.5 F (36.4 C)   Ht '5\' 6"'$  (1.676 m)   Wt 184 lb 3.2 oz (83.6 kg)   SpO2 96%   BMI 29.73 kg/m  GEN: NAD EYE: Sclerae anicteric ENT: MMM CV: Non-tachycardic Pulm: CTA b/l GI: Soft, NT/ND NEURO:  Alert & Oriented x 3   Gerrit Heck, DO [facility]   09/18/2021 11:10 AM

## 2021-09-18 NOTE — Telephone Encounter (Signed)
Patient needed her upcoming Appt. Date/time. Nothing else follows.

## 2021-09-22 ENCOUNTER — Telehealth: Payer: Self-pay | Admitting: *Deleted

## 2021-09-22 NOTE — Telephone Encounter (Signed)
  Follow up Call-     09/18/2021   11:01 AM 08/14/2021    1:45 PM  Call back number  Post procedure Call Back phone  # [phone]  Permission to leave phone message Yes Yes     Patient questions:  Do you have a fever, pain , or abdominal swelling? No. Pain Score  0 *  Have you tolerated food without any problems? Yes.    Have you been able to return to your normal activities? Yes.    Do you have any questions about your discharge instructions: Diet   No. Medications  No. Follow up visit  No.  Do you have questions or concerns about your Care? No.  Actions: * If pain score is 4 or above: No action needed, pain <4.

## 2021-09-25 ENCOUNTER — Other Ambulatory Visit: Payer: 59

## 2021-09-25 DIAGNOSIS — I10 Essential (primary) hypertension: Secondary | ICD-10-CM

## 2021-09-26 LAB — CBC WITH DIFFERENTIAL/PLATELET
Absolute Monocytes: 694 cells/uL (ref 200–950)
Basophils Absolute: 93 cells/uL (ref 0–200)
Basophils Relative: 1.5 %
Eosinophils Absolute: 81 cells/uL (ref 15–500)
Eosinophils Relative: 1.3 %
HCT: 42.8 % (ref 35.0–45.0)
Hemoglobin: 14.5 g/dL (ref 11.7–15.5)
Lymphs Abs: 2182 cells/uL (ref 850–3900)
MCH: 31.9 pg (ref 27.0–33.0)
MCHC: 33.9 g/dL (ref 32.0–36.0)
MCV: 94.1 fL (ref 80.0–100.0)
MPV: 11.3 fL (ref 7.5–12.5)
Monocytes Relative: 11.2 %
Neutro Abs: 3150 cells/uL (ref 1500–7800)
Neutrophils Relative %: 50.8 %
Platelets: 281 10*3/uL (ref 140–400)
RBC: 4.55 10*6/uL (ref 3.80–5.10)
RDW: 12.1 % (ref 11.0–15.0)
Total Lymphocyte: 35.2 %
WBC: 6.2 10*3/uL (ref 3.8–10.8)

## 2021-09-26 LAB — COMPLETE METABOLIC PANEL WITH GFR
AG Ratio: 1.4 (calc) (ref 1.0–2.5)
ALT: 21 U/L (ref 6–29)
AST: 15 U/L (ref 10–35)
Albumin: 4.1 g/dL (ref 3.6–5.1)
Alkaline phosphatase (APISO): 93 U/L (ref 37–153)
BUN: 10 mg/dL (ref 7–25)
CO2: 26 mmol/L (ref 20–32)
Calcium: 9.5 mg/dL (ref 8.6–10.4)
Chloride: 106 mmol/L (ref 98–110)
Creat: 0.78 mg/dL (ref 0.50–1.03)
Globulin: 2.9 g/dL (calc) (ref 1.9–3.7)
Glucose, Bld: 96 mg/dL (ref 65–99)
Potassium: 4.1 mmol/L (ref 3.5–5.3)
Sodium: 141 mmol/L (ref 135–146)
Total Bilirubin: 0.4 mg/dL (ref 0.2–1.2)
Total Protein: 7 g/dL (ref 6.1–8.1)
eGFR: 88 mL/min/{1.73_m2} (ref >=60)

## 2021-09-26 LAB — LIPID PANEL
Cholesterol: 201 mg/dL — ABNORMAL HIGH (ref <200)
HDL: 46 mg/dL — ABNORMAL LOW (ref >=50)
LDL Cholesterol (Calc): 130 mg/dL (calc) — ABNORMAL HIGH
Non-HDL Cholesterol (Calc): 155 mg/dL (calc) — ABNORMAL HIGH (ref <130)
Total CHOL/HDL Ratio: 4.4 (calc) (ref <5.0)
Triglycerides: 137 mg/dL (ref <150)

## 2021-09-30 ENCOUNTER — Encounter: Payer: Self-pay | Admitting: Family

## 2021-09-30 ENCOUNTER — Ambulatory Visit (INDEPENDENT_AMBULATORY_CARE_PROVIDER_SITE_OTHER): Payer: 59 | Admitting: Family

## 2021-09-30 VITALS — BP 110/86 | HR 95 | Temp 97.7°F | Resp 16 | Ht 66.0 in | Wt 180.4 lb

## 2021-09-30 DIAGNOSIS — Z23 Encounter for immunization: Secondary | ICD-10-CM

## 2021-09-30 DIAGNOSIS — R29898 Other symptoms and signs involving the musculoskeletal system: Secondary | ICD-10-CM | POA: Diagnosis not present

## 2021-09-30 DIAGNOSIS — E785 Hyperlipidemia, unspecified: Secondary | ICD-10-CM

## 2021-09-30 DIAGNOSIS — I10 Essential (primary) hypertension: Secondary | ICD-10-CM

## 2021-09-30 MED ORDER — TETANUS-DIPHTH-ACELL PERTUSSIS 5-2.5-18.5 LF-MCG/0.5 IM SUSP: 0.5000 mL | Freq: Once | INTRAMUSCULAR | 0 refills | Status: AC

## 2021-09-30 MED ORDER — ATORVASTATIN CALCIUM 10 MG PO TABS: 10.0000 mg | ORAL_TABLET | Freq: Every day | ORAL | 3 refills | Status: DC

## 2021-09-30 NOTE — Patient Instructions (Addendum)
-   Please get Tdap vaccine at the pharmacy. Script send to pharmacy today  Please contact your local pharmacy, previous provider, or insurance carrier for vaccine/immunization records. Ensure that any procedures done outside of [facility] are faxed to [place] [phone] or you can sign release of records form at the front desk to keep your medical record updated.

## 2021-09-30 NOTE — Assessment & Plan Note (Signed)
Blood stable  No home readings for evaluation. - continue on metoprolol and amlodipine

## 2021-09-30 NOTE — Assessment & Plan Note (Signed)
-   script send to pharmacy on previous visit but did not get vaccine unclear why. - reminded to get Tdap vaccine at her pharmacy  - script resend to pharmacy

## 2021-09-30 NOTE — Assessment & Plan Note (Signed)
Reports unable to stand for prolong period of time at work so has not been working since 2022 requested.Neuro intact and Bilateral UE and LE 5/5 discussed referral to Neurology for further evaluation.

## 2021-09-30 NOTE — Progress Notes (Signed)
Provider: Marlowe Sax FNP-C   Kayra Crowell, Nelda Bucks, NP  Patient Care Team: Lowell Mcgurk, Nelda Bucks, NP as PCP - General (Family Medicine)  Extended Emergency Contact Information Primary Emergency Contact: Gazelle Address: [place], [place] [address] [place] Phone: [phone] Relation: Daughter Secondary Emergency Contact: FOUST,PATRICIA Address: [place], [place] [address] [place] Phone: [phone] Mobile Phone: [phone] Relation: Mother  Code Status:  Full Code  Goals of care: Advanced Directive information    09/30/2021    9:44 AM  Advanced Directives  Does Patient Have a Medical Advance Directive? No  Would patient like information on creating a medical advance directive? No - Patient declined     Chief Complaint  Patient presents with   Medical Management of Chronic Issues    3 month follow up.   Health Maintenance    Discuss the need for Mammogram.   Immunizations    Discuss the need for Tetanus vaccine, Shingrix vaccine, and Covid Booster.    HPI:  Pt is a 61 y.o. female seen today for 3 months follow up for medical management of chronic diseases.  Has a medical history of essential hypertension.  Currently on Metroprolol 50 mg twice daily and amlodipine 5 mg daily.denies any headache,dizziness,vision changes,fatigue,chest tightness,palpitation,chest pain or shortness of breath.    No home blood pressures for review. Recent blood work done 09/25/2021 was unremarkable except total cholesterol 201 [previous 217] and LDL 130 [141] cholesterol was still high.  HDL low 46. Triglycerides have improved. Has not changed her diet.still eats fried foods. No form of Exercise. States overall feels like " nothing "but denies any depression. States cannot not stand for prolong period of time legs feel weak.Unable to work.   Health maintenance: Due for tetanus, 2nd Shingrix and COVID  booster vaccine.  Made aware to get vaccine at her pharmacy. Mammogram was ordered today 31 2023.   Past Medical History:  Diagnosis Date   Hypertension    Past Surgical History:  Procedure Laterality Date   COLONOSCOPY     TOTAL ABDOMINAL HYSTERECTOMY  2012    No Known Allergies  Allergies as of 09/30/2021   No Known Allergies      Medication List        Accurate as of September 30, 2021  9:58 AM. If you have any questions, ask your nurse or doctor.          amLODipine 5 MG tablet Commonly known as: NORVASC Take 1 tablet (5 mg total) by mouth daily.   metoprolol tartrate 50 MG tablet Commonly known as: LOPRESSOR Take 1 tablet (50 mg total) by mouth 2 (two) times daily.   Tdap 5-2.5-18.5 LF-MCG/0.5 injection Commonly known as: BOOSTRIX Inject 0.5 mLs into the muscle once.        Review of Systems  Constitutional:  Negative for appetite change, chills, fatigue, fever and unexpected weight change.  HENT:  Positive for dental problem. Negative for congestion, ear discharge, ear pain, facial swelling, hearing loss, nosebleeds, postnasal drip, rhinorrhea, sinus pressure, sinus pain, sneezing, sore throat, tinnitus and trouble swallowing.        Insurance does not cover dental  Eyes:  Negative for pain, discharge, redness, itching and visual disturbance.  Respiratory:  Negative for cough, chest tightness, shortness of breath and wheezing.   Cardiovascular:  Negative for chest  pain, palpitations and leg swelling.  Gastrointestinal:  Negative for abdominal distention, abdominal pain, blood in stool, constipation, diarrhea, nausea and vomiting.  Endocrine: Negative for cold intolerance, heat intolerance, polydipsia, polyphagia and polyuria.  Genitourinary:  Negative for difficulty urinating, dysuria, flank pain, frequency and urgency.  Musculoskeletal:  Negative for arthralgias, back pain, gait problem, joint swelling, myalgias, neck pain and neck stiffness.  Skin:  Negative  for color change, pallor, rash and wound.  Neurological:  Negative for dizziness, syncope, speech difficulty, light-headedness, numbness and headaches.       Generalized weakness   Hematological:  Does not bruise/bleed easily.  Psychiatric/Behavioral:  Negative for agitation, behavioral problems, confusion, hallucinations, self-injury, sleep disturbance and suicidal ideas. The patient is not nervous/anxious.    Immunization History  Administered Date(s) Administered   Moderna Sars-Covid-2 Vaccination 08/01/2019, 08/29/2019   PFIZER(Purple Top)SARS-COV-2 Vaccination 06/16/2021   Zoster Recombinat (Shingrix) 07/25/2021   Pertinent  Health Maintenance Due  Topic Date Due   MAMMOGRAM  02/02/2013   INFLUENZA VACCINE  11/24/2021   PAP SMEAR-Modifier  06/05/2024   COLONOSCOPY (Pts 45-19yr Insurance coverage will need to be confirmed)  09/19/2031      05/27/2021    4:53 PM 06/04/2021    4:30 PM 07/09/2021   10:28 AM 07/24/2021    9:49 AM 09/30/2021    9:44 AM  Fall Risk  Falls in the past year? 0 0 0 1 0  Was there an injury with Fall? 0 0 0 1 0  Fall Risk Category Calculator 0 0 0 2 0  Fall Risk Category Low Low Low Moderate Low  Patient Fall Risk Level Low fall risk Low fall risk Low fall risk Moderate fall risk Low fall risk  Patient at Risk for Falls Due to No Fall Risks No Fall Risks No Fall Risks History of fall(s) No Fall Risks  Fall risk Follow up Falls evaluation completed Falls evaluation completed Falls evaluation completed Falls evaluation completed;Education provided;Falls prevention discussed Falls evaluation completed   Functional Status Survey:    Vitals:   09/30/21 0940  BP: 110/86  Pulse: 95  Resp: 16  Temp: 97.7 F (36.5 C)  SpO2: 96%  Weight: 180 lb 6.4 oz (81.8 kg)  Height: _0  (1.676 m)   Body mass index is 29.12 kg/m. Physical Exam Vitals reviewed.  Constitutional:      General: She is not in acute distress.    Appearance: Normal appearance. She is  normal weight. She is not ill-appearing or diaphoretic.  HENT:     Head: Normocephalic.     Right Ear: Tympanic membrane, ear canal and external ear normal. There is no impacted cerumen.     Left Ear: Tympanic membrane, ear canal and external ear normal. There is no impacted cerumen.     Nose: Nose normal. No congestion or rhinorrhea.     Mouth/Throat:     Mouth: Mucous membranes are moist.     Pharynx: Oropharynx is clear. No oropharyngeal exudate or posterior oropharyngeal erythema.  Eyes:     General: No scleral icterus.       Right eye: No discharge.        Left eye: No discharge.     Extraocular Movements: Extraocular movements intact.     Conjunctiva/sclera: Conjunctivae normal.     Pupils: Pupils are equal, round, and reactive to light.  Neck:     Vascular: No carotid bruit.  Cardiovascular:     Rate and Rhythm: Normal rate and regular rhythm.  Pulses: Normal pulses.     Heart sounds: Normal heart sounds. No murmur heard.   No friction rub. No gallop.  Pulmonary:     Effort: Pulmonary effort is normal. No respiratory distress.     Breath sounds: Normal breath sounds. No wheezing, rhonchi or rales.  Chest:     Chest wall: No tenderness.  Abdominal:     General: Bowel sounds are normal. There is no distension.     Palpations: Abdomen is soft. There is no mass.     Tenderness: There is no abdominal tenderness. There is no right CVA tenderness, left CVA tenderness, guarding or rebound.  Musculoskeletal:        General: No swelling or tenderness. Normal range of motion.     Cervical back: Normal range of motion. No rigidity or tenderness.     Right lower leg: No edema.     Left lower leg: No edema.     Comments: UE/LE strength 5/5   Lymphadenopathy:     Cervical: No cervical adenopathy.  Skin:    General: Skin is warm and dry.     Coloration: Skin is not pale.     Findings: No bruising, erythema, lesion or rash.  Neurological:     Mental Status: She is alert and  oriented to person, place, and time.     Cranial Nerves: No cranial nerve deficit.     Sensory: No sensory deficit.     Motor: No weakness.     Coordination: Coordination normal.     Gait: Gait normal.  Psychiatric:        Mood and Affect: Mood is depressed. Affect is flat.        Speech: Speech normal.        Behavior: Behavior normal.        Thought Content: Thought content normal.        Judgment: Judgment normal.     Comments: Mood depressed but declines     Labs reviewed: Recent Labs    05/28/21 1059 09/25/21 1011  NA 139 141  K 4.1 4.1  CL 103 106  CO2 29 26  GLUCOSE 95 96  BUN 7 10  CREATININE 0.79 0.78  CALCIUM 10.0 9.5   Recent Labs    05/28/21 1059 09/25/21 1011  AST 13 15  ALT 14 21  BILITOT 0.5 0.4  PROT 7.2 7.0   Recent Labs    05/28/21 1059 09/25/21 1011  WBC 8.8 6.2  NEUTROABS 5,782 3,150  HGB 14.6 14.5  HCT 44.3 42.8  MCV 94.5 94.1  PLT 304 281   Lab Results  Component Value Date   TSH 1.53 05/28/2021   No results found for: HGBA1C Lab Results  Component Value Date   CHOL 201 (H) 09/25/2021   HDL 46 (L) 09/25/2021   LDLCALC 130 (H) 09/25/2021   TRIG 137 09/25/2021   CHOLHDL 4.4 09/25/2021    Significant Diagnostic Results in last 30 days:  No results found.  Assessment/Plan Problem List Items Addressed This Visit       Cardiovascular and Mediastinum   Essential hypertension - Primary    Blood stable  No home readings for evaluation. - continue on metoprolol and amlodipine        Relevant Medications   atorvastatin (LIPITOR) 10 MG tablet   Other Relevant Orders   CBC with Differential/Platelet   CMP with eGFR([facility])     Other   Weakness of both lower extremities    Reports  unable to stand for prolong period of time at work so has not been working since 2022 requested.Neuro intact and Bilateral UE and LE 5/5 discussed referral to Neurology for further evaluation.        Relevant Orders   Ambulatory referral to  Neurology   Need for Tdap vaccination    - script send to pharmacy on previous visit but did not get vaccine unclear why. - reminded to get Tdap vaccine at her pharmacy  - script resend to pharmacy       Relevant Medications   Tdap ([facility]) 5-2.5-18.5 LF-MCG/0.5 injection   Hyperlipidemia LDL goal <100    LDL improved but still high  Has not made any dietary modification with fried food and does not do any form of exercise  -  Dietary modification and exercise at least 3 times per week for 30 minutes advised.       Relevant Medications   atorvastatin (LIPITOR) 10 MG tablet   Other Relevant Orders   Lipid panel    Family/ staff Communication: Reviewed plan of care with patient   Labs/tests ordered:  - CBC with Differential/Platelet - CMP with eGFR([facility]) - Lipid panel  Next Appointment : 6 months for medical management of chronic issues.Fasting Labs prior to visit.    Sandrea Hughs, NP

## 2021-09-30 NOTE — Assessment & Plan Note (Signed)
LDL improved but still high  Has not made any dietary modification with fried food and does not do any form of exercise  -  Dietary modification and exercise at least 3 times per week for 30 minutes advised.

## 2022-02-03 ENCOUNTER — Other Ambulatory Visit: Payer: Self-pay | Admitting: Physician Assistant

## 2022-02-03 DIAGNOSIS — Z1231 Encounter for screening mammogram for malignant neoplasm of breast: Secondary | ICD-10-CM

## 2022-03-26 ENCOUNTER — Other Ambulatory Visit: Payer: Commercial Managed Care - HMO

## 2022-03-26 DIAGNOSIS — E785 Hyperlipidemia, unspecified: Secondary | ICD-10-CM

## 2022-03-26 DIAGNOSIS — I10 Essential (primary) hypertension: Secondary | ICD-10-CM

## 2022-03-31 ENCOUNTER — Encounter: Payer: 59 | Admitting: Family

## 2022-03-31 ENCOUNTER — Encounter: Payer: Self-pay | Admitting: Family

## 2022-03-31 NOTE — Progress Notes (Signed)
  This encounter was created in error - please disregard. No show 

## 2022-04-26 DIAGNOSIS — I639 Cerebral infarction, unspecified: Secondary | ICD-10-CM

## 2022-04-26 HISTORY — DX: Cerebral infarction, unspecified: I63.9

## 2022-11-09 ENCOUNTER — Encounter (HOSPITAL_COMMUNITY): Payer: Self-pay

## 2022-11-09 ENCOUNTER — Telehealth: Payer: Self-pay

## 2022-11-09 ENCOUNTER — Emergency Department (HOSPITAL_COMMUNITY): Payer: 59

## 2022-11-09 ENCOUNTER — Other Ambulatory Visit: Payer: Self-pay

## 2022-11-09 ENCOUNTER — Ambulatory Visit: Payer: 59 | Admitting: Adult Health

## 2022-11-09 ENCOUNTER — Emergency Department (HOSPITAL_COMMUNITY)
Admission: EM | Admit: 2022-11-09 | Discharge: 2022-11-09 | Disposition: A | Discharge status: Home / Self Care | Payer: 59 | Attending: Emergency Medicine | Admitting: Emergency Medicine

## 2022-11-09 DIAGNOSIS — R2990 Unspecified symptoms and signs involving the nervous system: Secondary | ICD-10-CM

## 2022-11-09 DIAGNOSIS — R4701 Aphasia: Secondary | ICD-10-CM | POA: Diagnosis not present

## 2022-11-09 DIAGNOSIS — R531 Weakness: Secondary | ICD-10-CM | POA: Diagnosis not present

## 2022-11-09 DIAGNOSIS — Z79899 Other long term (current) drug therapy: Secondary | ICD-10-CM | POA: Insufficient documentation

## 2022-11-09 DIAGNOSIS — H538 Other visual disturbances: Secondary | ICD-10-CM | POA: Diagnosis not present

## 2022-11-09 DIAGNOSIS — I10 Essential (primary) hypertension: Secondary | ICD-10-CM

## 2022-11-09 LAB — COMPREHENSIVE METABOLIC PANEL
ALT: 17 U/L (ref 0–44)
AST: 16 U/L (ref 15–41)
Albumin: 3.4 g/dL — ABNORMAL LOW (ref 3.5–5.0)
Alkaline Phosphatase: 100 U/L (ref 38–126)
Anion gap: 8 (ref 5–15)
BUN: 6 mg/dL (ref 6–20)
CO2: 25 mmol/L (ref 22–32)
Calcium: 9.3 mg/dL (ref 8.9–10.3)
Chloride: 105 mmol/L (ref 98–111)
Creatinine, Ser: 0.91 mg/dL (ref 0.44–1.00)
GFR, Estimated: >60 mL/min (ref >60)
Glucose, Bld: 153 mg/dL — ABNORMAL HIGH (ref 70–99)
Potassium: 3.6 mmol/L (ref 3.5–5.1)
Sodium: 138 mmol/L (ref 135–145)
Total Bilirubin: 0.6 mg/dL (ref 0.3–1.2)
Total Protein: 6.8 g/dL (ref 6.5–8.1)

## 2022-11-09 LAB — I-STAT CHEM 8, ED
BUN: 6 mg/dL (ref 6–20)
Calcium, Ion: 1.19 mmol/L (ref 1.15–1.40)
Chloride: 104 mmol/L (ref 98–111)
Creatinine, Ser: 0.9 mg/dL (ref 0.44–1.00)
Glucose, Bld: 152 mg/dL — ABNORMAL HIGH (ref 70–99)
HCT: 43 % (ref 36.0–46.0)
Hemoglobin: 14.6 g/dL (ref 12.0–15.0)
Potassium: 3.8 mmol/L (ref 3.5–5.1)
Sodium: 140 mmol/L (ref 135–145)
TCO2: 25 mmol/L (ref 22–32)

## 2022-11-09 LAB — CBC WITH DIFFERENTIAL/PLATELET
Abs Immature Granulocytes: 0.02 10*3/uL (ref 0.00–0.07)
Basophils Absolute: 0.1 10*3/uL (ref 0.0–0.1)
Basophils Relative: 1 %
Eosinophils Absolute: 0.1 10*3/uL (ref 0.0–0.5)
Eosinophils Relative: 1 %
HCT: 42.7 % (ref 36.0–46.0)
Hemoglobin: 14.2 g/dL (ref 12.0–15.0)
Immature Granulocytes: 0 %
Lymphocytes Relative: 28 %
Lymphs Abs: 2.2 10*3/uL (ref 0.7–4.0)
MCH: 31.3 pg (ref 26.0–34.0)
MCHC: 33.3 g/dL (ref 30.0–36.0)
MCV: 94.3 fL (ref 80.0–100.0)
Monocytes Absolute: 0.4 10*3/uL (ref 0.1–1.0)
Monocytes Relative: 5 %
Neutro Abs: 5 10*3/uL (ref 1.7–7.7)
Neutrophils Relative %: 65 %
Platelets: 312 10*3/uL (ref 150–400)
RBC: 4.53 MIL/uL (ref 3.87–5.11)
RDW: 12.5 % (ref 11.5–15.5)
WBC: 7.8 10*3/uL (ref 4.0–10.5)
nRBC: 0 % (ref 0.0–0.2)

## 2022-11-09 LAB — URINALYSIS, ROUTINE W REFLEX MICROSCOPIC
Bilirubin Urine: NEGATIVE
Glucose, UA: NEGATIVE mg/dL
Hgb urine dipstick: NEGATIVE
Ketones, ur: NEGATIVE mg/dL
Leukocytes,Ua: NEGATIVE
Nitrite: NEGATIVE
Protein, ur: 30 mg/dL — AB
Specific Gravity, Urine: 1.023 (ref 1.005–1.030)
pH: 5 (ref 5.0–8.0)

## 2022-11-09 LAB — CBG MONITORING, ED: Glucose-Capillary: 148 mg/dL — ABNORMAL HIGH (ref 70–99)

## 2022-11-09 LAB — LIPASE, BLOOD: Lipase: 27 U/L (ref 11–51)

## 2022-11-09 MED ORDER — METOPROLOL TARTRATE 50 MG PO TABS: 50.0000 mg | ORAL_TABLET | Freq: Two times a day (BID) | ORAL | 1 refills | Status: DC

## 2022-11-09 MED ORDER — AMLODIPINE BESYLATE 5 MG PO TABS: 5.0000 mg | ORAL_TABLET | Freq: Every day | ORAL | 1 refills | Status: DC

## 2022-11-09 NOTE — Telephone Encounter (Signed)
I called patient to ask further questions about her symptoms. Patient called and spoke to front administration and report weakness, and unable to speak well. I called to ask patient if she's having left side weakness, crooked smile, slurred speech, vision issues, etc. Due to those symptoms being high alert for possible stroke. Im unable to leave voicemail for patient. She has voicemail box that hasn't been set up yet. Another attempt will be made to contact patient. Patient has appointment today at 2:40pm with Synthia Innocent, NP

## 2022-11-09 NOTE — ED Provider Triage Note (Signed)
Emergency Medicine Provider Triage Evaluation Note  SWEETIE GIEBLER , a 62 y.o. female  was evaluated in triage.  Pt complains of blurry vision and generalized weakness going on "for a while."  Patient called to make a vision exam however it is not until apparently March.  She was concerned she may need evaluation sooner.  She denies a history of diabetes.  Review of Systems  Positive: Bilateral blurry vision Negative: Unilateral weakness headache  Physical Exam  BP (!) 160/114   Pulse (!) 105   Temp 98.8 F (37.1 C) (Oral)   Resp 18   SpO2 96%  Gen:   Awake, no distress   Resp:  Normal effort  MSK:   Moves extremities without difficulty  Other:  No facial droop  Medical Decision Making  Medically screening exam initiated at 12:42 PM.  Appropriate orders placed.  MAURIE MUSCO was informed that the remainder of the evaluation will be completed by another provider, this initial triage assessment does not replace that evaluation, and the importance of remaining in the ED until their evaluation is complete.     Arthor Captain, PA-C 11/09/22 1244

## 2022-11-09 NOTE — ED Notes (Signed)
Patient transported to MRI 

## 2022-11-09 NOTE — Telephone Encounter (Signed)
If having weakness and unable to speech recommend to go to ED  as soon as possible for further evaluation.

## 2022-11-09 NOTE — Telephone Encounter (Signed)
Noted. Message routed to front administration.

## 2022-11-09 NOTE — Telephone Encounter (Signed)
 Noted  

## 2022-11-09 NOTE — ED Provider Notes (Signed)
Care assumed from [facility], PA-C at shift change pending MRI. See her note for full HPI.  In short, patient is a 62 year old female who presents to the ED due to blurry vision, difficulty speaking/word finding and generalized weakness which has been ongoing for the past few months.  Also notes a decreased appetite.  Has not been on her medications for greater than a year.  Does not currently follow a PCP.  Plan from previous provider, follow-up on MRI brain to rule out acute CVA. Physical Exam  BP (!) 148/112   Pulse 79   Temp 98.8 F (37.1 C) (Oral)   Resp 16   Ht 5\' 5"  (1.651 m)   SpO2 98%   BMI 30.02 kg/m   Physical Exam Vitals and nursing note reviewed.  Constitutional:      General: She is not in acute distress.    Appearance: She is not ill-appearing.  HENT:     Head: Normocephalic.  Eyes:     Pupils: Pupils are equal, round, and reactive to light.  Cardiovascular:     Rate and Rhythm: Normal rate and regular rhythm.     Pulses: Normal pulses.     Heart sounds: Normal heart sounds. No murmur heard.    No friction rub. No gallop.  Pulmonary:     Effort: Pulmonary effort is normal.     Breath sounds: Normal breath sounds.  Abdominal:     General: Abdomen is flat. There is no distension.     Palpations: Abdomen is soft.     Tenderness: There is no abdominal tenderness. There is no guarding or rebound.  Musculoskeletal:        General: Normal range of motion.     Cervical back: Neck supple.  Skin:    General: Skin is warm and dry.  Neurological:     General: No focal deficit present.     Mental Status: She is alert.     Comments: Slow speech Equal grip strength No pronator drift  Psychiatric:        Mood and Affect: Mood normal.        Behavior: Behavior normal.     Procedures  Procedures  ED Course / MDM    Medical Decision Making Amount and/or Complexity of Data Reviewed Radiology: ordered and independent interpretation performed. Decision-making  details documented in ED Course.  Risk Prescription drug management.   MRI personally reviewed and interpreted negative for any acute abnormalities.  Does demonstrate innumerable small microhemorrhages.  Also demonstrates chronic infarcts.  Suspect patient's symptoms likely related to microhemorrhages and chronic infarcts. Will discuss with neurology for recommendations.   Discussed with Dr. Derry Lory with neurology who reviewed MRI. No emergent interventions at this time. Recommends strict BP control.  Patient notes she has been out of her hypertensive medications for numerous months.  Last BP 148/112.  Neurology feels that these symptoms likely secondary to hypertension for numerous months.  Patient discharged with amlodipine and Lopressor which she has been on previously.  MRI negative for acute CVA.  Ambulatory referral for neurology placed.  Patient stable for discharge. Strict ED precautions discussed with patient. Patient states understanding and agrees to plan. Patient discharged home in no acute distress and stable vitals        Jesusita Oka 11/09/22 1751    Benjiman Core, MD 11/09/22 [phone]

## 2022-11-09 NOTE — Discharge Instructions (Addendum)
It was a pleasure taking care of you today.  As discussed, your MRI did not show any new strokes however, did show old strokes.  It also showed findings suggestive of elevated blood pressure.  I have refilled your blood pressure medication.  Take as prescribed.  Please call your PCP to schedule an appointment for a recheck within the next week.  I have placed a referral to neurology.  They will call you within the next week to schedule an appointment. Return to the ER for new or worsening symptoms.

## 2022-11-09 NOTE — ED Provider Notes (Signed)
EMERGENCY DEPARTMENT AT [facility] Provider Note   CSN: [id number] Arrival date & time: 11/09/22  1225     History  No chief complaint on file.   Michele George is a 62 y.o. female with past medical history significant for hyperlipidemia, hypertension presents to the ED complaining of blurry vision, difficulty speaking and word finding, and generalized weakness that has been going on for the last few months.  She reports that it has been getting worse and she has had a decrease in her appetite.  She has not been able to get to a primary care doctor due to lack of insurance and has not been on any medication for at least a year.  She also endorses generalized abdominal pain "for awhile" without nausea, vomiting, or diarrhea.  Denies fever, urinary symptoms, shortness of breath, chest pain, dizziness, facial droop, headache, light-headedness, syncope.       Home Medications Prior to Admission medications   Medication Sig Start Date End Date Taking? Authorizing Provider  amLODipine (NORVASC) 5 MG tablet Take 1 tablet (5 mg total) by mouth daily. 07/28/21   Ngetich, Dinah C, NP  atorvastatin (LIPITOR) 10 MG tablet Take 1 tablet (10 mg total) by mouth daily. 09/30/21   Ngetich, Dinah C, NP  metoprolol tartrate (LOPRESSOR) 50 MG tablet Take 1 tablet (50 mg total) by mouth 2 (two) times daily. 07/28/21 09/30/21  Ngetich, Donalee Citrin, NP      Allergies    Patient has no known allergies.    Review of Systems   Review of Systems  Constitutional:  Negative for fever.  Respiratory:  Negative for shortness of breath.   Cardiovascular:  Negative for chest pain.  Gastrointestinal:  Negative for diarrhea, nausea and vomiting.  Genitourinary:  Negative for dysuria, frequency and urgency.  Musculoskeletal:  Negative for gait problem.  Neurological:  Positive for speech difficulty and weakness (generalized). Negative for dizziness, syncope, facial asymmetry, light-headedness and  headaches.    Physical Exam Updated Vital Signs BP (!) 160/114   Pulse (!) 105   Temp 98.8 F (37.1 C) (Oral)   Resp 18   Ht 5\' 5"  (1.651 m)   SpO2 96%   BMI 30.02 kg/m  Physical Exam Vitals and nursing note reviewed.  Constitutional:      General: She is not in acute distress.    Appearance: Normal appearance. She is ill-appearing. She is not diaphoretic.  HENT:     Head: Normocephalic and atraumatic.  Eyes:     General: Lids are normal. Vision grossly intact.     Extraocular Movements: Extraocular movements intact.     Conjunctiva/sclera: Conjunctivae normal.     Pupils: Pupils are equal, round, and reactive to light.  Cardiovascular:     Rate and Rhythm: Normal rate and regular rhythm.     Heart sounds: Normal heart sounds.  Pulmonary:     Effort: Pulmonary effort is normal.     Breath sounds: Normal breath sounds and air entry.  Abdominal:     General: Abdomen is flat.     Palpations: Abdomen is soft.     Tenderness: There is no abdominal tenderness.  Musculoskeletal:     Right lower leg: No edema.     Left lower leg: No edema.  Neurological:     Mental Status: She is alert and oriented to person, place, and time. Mental status is at baseline.     GCS: GCS eye subscore is 4. GCS verbal subscore  is 5. GCS motor subscore is 6.     Cranial Nerves: Dysarthria present. No cranial nerve deficit or facial asymmetry.     Motor: Weakness (bilateral lower extremities) present.     Coordination: Coordination is intact.     Comments: Patient has difficulty word findings.  Cranial Nerves:  II: peripheral fields grossly intact III,IV, VI: ptosis not present, extra-ocular movements intact bilaterally, direct and consensual pupillary light reflexes intact bilaterally V: facial sensation, jaw opening, and bite strength equal bilaterally VII: eyebrow raise, eyelid close, smile, frown, pucker equal bilaterally VIII: hearing grossly normal bilaterally  IX,X: palate elevation and  swallowing intact XI: bilateral shoulder shrug and lateral head rotation equal and strong XII: midline tongue extension Motor: 4/5 strength in BLE, 4/5 strength in RUE, 5/5 strength in LUE Sensation grossly intact to light touch Gait not assessed  Psychiatric:        Mood and Affect: Mood normal.        Speech: Speech is not slurred.        Behavior: Behavior normal.     ED Results / Procedures / Treatments   Labs (all labs ordered are listed, but only abnormal results are displayed) Labs Reviewed  COMPREHENSIVE METABOLIC PANEL - Abnormal; Notable for the following components:      Result Value   Glucose, Bld 153 (*)    Albumin 3.4 (*)    All other components within normal limits  URINALYSIS, ROUTINE W REFLEX MICROSCOPIC - Abnormal; Notable for the following components:   APPearance HAZY (*)    Protein, ur 30 (*)    Bacteria, UA FEW (*)    All other components within normal limits  CBG MONITORING, ED - Abnormal; Notable for the following components:   Glucose-Capillary 148 (*)    All other components within normal limits  I-STAT CHEM 8, ED - Abnormal; Notable for the following components:   Glucose, Bld 152 (*)    All other components within normal limits  CBC WITH DIFFERENTIAL/PLATELET  LIPASE, BLOOD    EKG EKG Interpretation Date/Time:  Tuesday November 09 2022 13:40:39 EDT Ventricular Rate:  101 PR Interval:  145 QRS Duration:  79 QT Interval:  360 QTC Calculation: 467 R Axis:   45  Text Interpretation: Sinus tachycardia Probable left atrial enlargement Probable left ventricular hypertrophy Borderline T abnormalities, lateral leads Confirmed by Gloris Manchester ([id number]) on 11/09/2022 2:48:05 PM  Radiology No results found.  Procedures Procedures    Medications Ordered in ED Medications - No data to display  ED Course/ Medical Decision Making/ A&P                             Medical Decision Making Amount and/or Complexity of Data Reviewed Radiology:  ordered.   This patient presents to the ED with chief complaint(s) of blurry vision, difficulty word finding, generalized weakness, abdominal pain with pertinent past medical history of hypertension, hyperlipidemia.  The complaint involves an extensive differential diagnosis and also carries with it a high risk of complications and morbidity.    The differential diagnosis includes CVA, acute intracranial abnormality, neurodegenerative disease, metabolic derangement, infectious etiology   The initial plan is to obtain labs, CT head  Additional history obtained: Records reviewed  - patient reached out to PCP office and was advised to go to the ED for evaluation due to signs of a possible stroke.  Patient's last family medicine visit appears to be 01/22/2022.  Initial Assessment:   Exam significant for ill-appearing patient who is not in acute distress.  Patient is alert and oriented.  She does have difficulty with word finding and is stumbling over her words.  4/5 strength in RUE and BLE.  5/5 strength in LUE.  Grip strength is equal.  No facial droop or slurring of speech.  Heart rate is normal in the 70s with regular rhythm.  Lungs clear to auscultation bilaterally.   Independent ECG/labs interpretation:  The following labs were independently interpreted:  CBC without leukocytosis or anemia.  Metabolic panel without major electrolyte disturbance.  UA without evidence of infection.   Independent visualization and interpretation of imaging: I independently visualized the following imaging with scope of interpretation limited to determining acute life threatening conditions related to emergency care: CT head, which revealed age-indeterminate right thalamic infarct.  Will follow up with MRI.   Disposition:   3:43 PM Care transferred to [facility], PA-C at the end of my shift as the patient will require reassessment once labs/imaging have resulted. Patient presentation, ED course, and plan of  care discussed with review of all pertinent labs and imaging. Please see his/her note for further details regarding further ED course and disposition. Plan at time of handoff is follow up on MRI imaging and determine disposition. This may be altered or completely changed at the discretion of the oncoming team pending results of further workup.          Final Clinical Impression(s) / ED Diagnoses Final diagnoses:  None    Rx / DC Orders ED Discharge Orders     None         Lenard Simmer, PA-C 11/09/22 1544    Gloris Manchester, MD 11/09/22 1704

## 2022-11-09 NOTE — Telephone Encounter (Signed)
Patient called office back and I told her per PCP ( Dinah Ngetich, NP that at this time she needs to go to the emergency department as she is having signs of a possible stroke. Told patients daughter to reach out to [place] after hospital visit and we will get her seen by her PCP ASAP. Patient appointment cancelled for now. Routed to provider as well.

## 2022-11-09 NOTE — ED Triage Notes (Signed)
Pt c/o blurry vision, difficulty speaking, generalized weakness "for a long time"; states she has not been able to go to doctor due to lack of insurance; pt answering questions appropriately in triage, no unilateral weakness noted; also endorses generalized abd pain "for a while"; denies N/V, denies urinary issues, denies fevers

## 2022-11-10 ENCOUNTER — Telehealth: Payer: Self-pay

## 2022-11-10 ENCOUNTER — Encounter: Payer: Self-pay | Admitting: Family

## 2022-11-10 NOTE — Transitions of Care (Post Inpatient/ED Visit) (Signed)
   11/10/2022  Name: Michele George MRN: [id number] DOB: 05-23-1960  Today's TOC FU Call Status: Today's TOC FU Call Status:: Unsuccessul Call (1st Attempt) Unsuccessful Call (1st Attempt) Date: 11/10/22  Attempted to reach the patient regarding the most recent Inpatient/ED visit.  Follow Up Plan: Additional outreach attempts will be made to reach the patient to complete the Transitions of Care (Post Inpatient/ED visit) call.   Signature : Guss Bunde [facility]

## 2022-11-10 NOTE — Telephone Encounter (Signed)
Patient went to ED, spent 5 hours, acute stroke ruled out, and told to follow-up with PCP. Outgoing call placed to patient to schedule a follow-up with PCP, no answer and no voicemail. We will need to make future attempts to reach patient for a follow-up.  MyChart message sent to patient as another avenue of communication to schedule a follow-up

## 2022-11-11 ENCOUNTER — Encounter: Payer: Self-pay | Admitting: Family

## 2022-11-11 ENCOUNTER — Ambulatory Visit (INDEPENDENT_AMBULATORY_CARE_PROVIDER_SITE_OTHER): Payer: 59 | Admitting: Family

## 2022-11-11 VITALS — BP 148/90 | HR 71 | Temp 97.1°F | Resp 16 | Ht 65.0 in | Wt 182.0 lb

## 2022-11-11 DIAGNOSIS — R739 Hyperglycemia, unspecified: Secondary | ICD-10-CM

## 2022-11-11 DIAGNOSIS — E785 Hyperlipidemia, unspecified: Secondary | ICD-10-CM

## 2022-11-11 DIAGNOSIS — H538 Other visual disturbances: Secondary | ICD-10-CM

## 2022-11-11 DIAGNOSIS — F17209 Nicotine dependence, unspecified, with unspecified nicotine-induced disorders: Secondary | ICD-10-CM

## 2022-11-11 DIAGNOSIS — R531 Weakness: Secondary | ICD-10-CM | POA: Diagnosis not present

## 2022-11-11 DIAGNOSIS — I10 Essential (primary) hypertension: Secondary | ICD-10-CM | POA: Diagnosis not present

## 2022-11-11 MED ORDER — NICOTINE 21-14-7 MG/24HR TD KIT
PACK | TRANSDERMAL | 0 refills | Status: DC
Start: 2022-11-11 — End: 2022-11-12

## 2022-11-11 NOTE — Progress Notes (Unsigned)
Provider: Richarda Blade FNP-C  Bailey Faiella, Donalee Citrin, NP  Patient Care Team: Thai Burgueno, Donalee Citrin, NP as PCP - General (Family Medicine)  Extended Emergency Contact Information Primary Emergency Contact: FOUST,PATRICIA Address: [address]          [place], [place] [address] [place] Home Phone: [phone] Mobile Phone: [phone] Relation: Mother Secondary Emergency Contact: Charma Igo Address: [address]          [place], [place] [address] [place] Phone: [phone] Relation: Daughter  Code Status:  Full Code  Goals of care: Advanced Directive information    11/10/2022    1:13 PM  Advanced Directives  Does Patient Have a Medical Advance Directive? No  Would patient like information on creating a medical advance directive? No - Patient declined     Chief Complaint  Patient presents with   Transitions Of Care    Patient is here for ED F/U for stroke symptoms states she still feels the same, gait off/unbalanced as well    HPI:  Pt is a 62 y.o. female seen today for an acute visit for follow up ED visit 11/09/2022 for stroke like symptoms after presenting to ED due to blurry vision ,difficulty speaking /word finding and generalized weakness ongoing for few months.Also had decreased appetite.Has been out of her blood pressure medication for more than one year due to lack of insurance therefore not followed up with PCP.   CT scan showed age -indeterminate right thalamic infarct. MRI was negative for acute abnormalities but showed innumerable small microhemorrhages.Also showed chronic infarcts .Neurology was consulted.Felt symptoms were due to elevated B/p 148/112.Out patient follow up was recommended.she was discharged home on Amlodipine and Lopressor which she took previously.  States has appointment with Ophthalmology in march,2025  States smokes 3 cigarette during the day and a couple more in the evening.she agrees to start on  medication to help her quit.  She does not drink any alcohol.   Past Medical History:  Diagnosis Date   Hypertension    Past Surgical History:  Procedure Laterality Date   COLONOSCOPY     TOTAL ABDOMINAL HYSTERECTOMY  2012    No Known Allergies  Outpatient Encounter Medications as of 11/11/2022  Medication Sig   amLODipine (NORVASC) 5 MG tablet Take 1 tablet (5 mg total) by mouth daily. (Patient not taking: Reported on 11/10/2022)   atorvastatin (LIPITOR) 10 MG tablet Take 1 tablet (10 mg total) by mouth daily. (Patient not taking: Reported on 11/10/2022)   metoprolol tartrate (LOPRESSOR) 50 MG tablet Take 1 tablet (50 mg total) by mouth 2 (two) times daily. (Patient not taking: Reported on 11/10/2022)   No facility-administered encounter medications on file as of 11/11/2022.    Review of Systems  Immunization History  Administered Date(s) Administered   DTaP 05/28/1965, 07/09/1965, 08/13/1965, 09/09/1966   IPV 05/28/1965, 07/09/1965, 08/13/1965, 08/09/1978   MODERNA SARS COV-2 Pediatric Vaccination 6mos to <22yrs 08/01/2019, 08/29/2019   Measles 07/02/1965   Moderna Sars-Covid-2 Vaccination 08/01/2019, 08/29/2019   PFIZER(Purple Top)SARS-COV-2 Vaccination 06/16/2021   Rubella 05/28/1968   Smallpox 07/09/1965   Td 11/07/2020   Tdap 11/07/2020   Zoster Recombinant(Shingrix) 07/25/2021, 10/01/2021   Pertinent  Health Maintenance Due  Topic Date Due   MAMMOGRAM  02/02/2013   INFLUENZA VACCINE  11/25/2022   PAP SMEAR-Modifier  06/05/2024   Colonoscopy  09/19/2031      07/09/2021   10:28 AM 07/24/2021    9:49 AM 09/30/2021  9:44 AM 03/31/2022    8:29 AM 11/10/2022    1:13 PM  Fall Risk  Falls in the past year? 0 1 0 0 0  Was there an injury with Fall? 0 1 0 0 0  Fall Risk Category Calculator 0 2 0 0 0  Fall Risk Category (Retired) Low Moderate Low Low   (RETIRED) Patient Fall Risk Level Low fall risk Moderate fall risk Low fall risk Low fall risk   Patient at Risk for  Falls Due to No Fall Risks History of fall(s) No Fall Risks No Fall Risks No Fall Risks  Fall risk Follow up Falls evaluation completed Falls evaluation completed;Education provided;Falls prevention discussed Falls evaluation completed Falls evaluation completed Falls evaluation completed   Functional Status Survey:    Vitals:   11/11/22 0903 11/11/22 0910  BP: (!) 140/80 (!) 148/90  Pulse:  71  Resp:  16  Temp:  (!) 97.1 F (36.2 C)  TempSrc:  Temporal  SpO2:  97%  Weight:  182 lb (82.6 kg)  Height: 5\' 5"  (1.651 m) 5\' 5"  (1.651 m)   Body mass index is 30.29 kg/m. Physical Exam  Labs reviewed: Recent Labs    11/09/22 1317 11/09/22 1324  NA 138 140  K 3.6 3.8  CL 105 104  CO2 25  --   GLUCOSE 153* 152*  BUN 6 6  CREATININE 0.91 0.90  CALCIUM 9.3  --    Recent Labs    11/09/22 1317  AST 16  ALT 17  ALKPHOS 100  BILITOT 0.6  PROT 6.8  ALBUMIN 3.4*   Recent Labs    11/09/22 1317 11/09/22 1324  WBC 7.8  --   NEUTROABS 5.0  --   HGB 14.2 14.6  HCT 42.7 43.0  MCV 94.3  --   PLT 312  --    Lab Results  Component Value Date   TSH 1.53 05/28/2021   No results found for: "HGBA1C" Lab Results  Component Value Date   CHOL 201 (H) 09/25/2021   HDL 46 (L) 09/25/2021   LDLCALC 130 (H) 09/25/2021   TRIG 137 09/25/2021   CHOLHDL 4.4 09/25/2021    Significant Diagnostic Results in last 30 days:  MR BRAIN WO CONTRAST  Result Date: 11/09/2022 CLINICAL DATA:  Stroke, follow up EXAM: MRI HEAD WITHOUT CONTRAST TECHNIQUE: Multiplanar, multiecho pulse sequences of the brain and surrounding structures were obtained without intravenous contrast. COMPARISON:  CT Head 10/10/22 FINDINGS: Brain: Negative for acute infarct. No hydrocephalus. No extra-axial fluid collection. There are chronic infarcts in the bilateral basal ganglia and thalami. There is sequela of moderate chronic microvascular ischemic change. There are innumerable small microhemorrhages predominantly in a  central location. There are two lobar microhemorrhages in the bilateral frontal lobes (series 11, image 32, 35) Vascular: Normal flow voids. Skull and upper cervical spine: Normal marrow signal. Sinuses/Orbits: Middle ear or mastoid effusion. Paranasal sinuses are notable for mucosal thickening in the bilateral ethmoid sinuses and left maxillary sinus. Orbits are unremarkable. Other: None. IMPRESSION: 1. No acute intracranial process. 2. Innumerable small microhemorrhages predominantly in a central location, favored to be hypertensive in nature. 3. 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 Electronically Signed   By: Lorenza Cambridge M.D.   On: 11/09/2022 16:52   CT Head Wo Contrast  Result Date: 11/09/2022 CLINICAL DATA:  Neuro deficit, acute, stroke suspected EXAM: CT HEAD WITHOUT CONTRAST TECHNIQUE: Contiguous axial images were obtained from the base of the skull through the  vertex without intravenous contrast. RADIATION DOSE REDUCTION: This exam was performed according to the departmental dose-optimization program which includes automated exposure control, adjustment of the mA and/or kV according to patient size and/or use of iterative reconstruction technique. COMPARISON:  None Available. FINDINGS: Brain: There is an age indeterminate right thalamic infarct (series 3, image 16). No evidence of hemorrhage, hydrocephalus, extra-axial collection or mass lesion/mass effect. Sequela of moderate chronic microvascular ischemic change. Vascular: No hyperdense vessel or unexpected calcification. Skull: Normal. Negative for fracture or focal lesion. Sinuses/Orbits: No middle ear or mastoid effusion. Paranasal sinuses are notable for mucosal thickening in the left maxillary sinus with osseous changes suggestive of chronic left maxillary sinusitis. Orbits are unremarkable. Other: None. IMPRESSION: Age-indeterminate right thalamic infarct. If there is clinical concern for an acute infarct, consider  further evaluation with MRI. Electronically Signed   By: Lorenza Cambridge M.D.   On: 11/09/2022 15:03    Assessment/Plan There are no diagnoses linked to this encounter.   Family/ staff Communication: Reviewed plan of care with patient  Labs/tests ordered: None   Next Appointment:   Caesar Bookman, NP

## 2022-11-12 ENCOUNTER — Other Ambulatory Visit: Payer: Self-pay | Admitting: Family

## 2022-11-12 ENCOUNTER — Telehealth: Payer: Self-pay

## 2022-11-12 DIAGNOSIS — F17209 Nicotine dependence, unspecified, with unspecified nicotine-induced disorders: Secondary | ICD-10-CM

## 2022-11-12 LAB — LIPID PANEL
Cholesterol: 197 mg/dL (ref <200)
HDL: 37 mg/dL — ABNORMAL LOW (ref >=50)
LDL Cholesterol (Calc): 132 mg/dL (calc) — ABNORMAL HIGH
Non-HDL Cholesterol (Calc): 160 mg/dL (calc) — ABNORMAL HIGH (ref <130)
Total CHOL/HDL Ratio: 5.3 (calc) — ABNORMAL HIGH (ref <5.0)
Triglycerides: 152 mg/dL — ABNORMAL HIGH (ref <150)

## 2022-11-12 LAB — HEMOGLOBIN A1C
Hgb A1c MFr Bld: 6 % of total Hgb — ABNORMAL HIGH (ref <5.7)
Mean Plasma Glucose: 126 mg/dL
eAG (mmol/L): 7 mmol/L

## 2022-11-12 LAB — TSH: TSH: 1.07 mIU/L (ref 0.40–4.50)

## 2022-11-12 MED ORDER — NICOTINE 21-14-7 MG/24HR TD KIT
PACK | TRANSDERMAL | 0 refills | Status: DC
Start: 2022-11-12 — End: 2022-11-25

## 2022-11-12 NOTE — Telephone Encounter (Signed)
Nicotine  21 mg patch Apply one on transdermal daily x 6 weeks then  Apply 14 mg patch daily x 2 weeks then  Apply 7 mg patch daily x 2  weeks.Please stop cigarette use once starting on nicotine patch.

## 2022-11-12 NOTE — Telephone Encounter (Signed)
Please send prescription to pharmacy.

## 2022-11-12 NOTE — Telephone Encounter (Signed)
Patient called stating that pharmacy sent message regarding prescription for nicotine patch to our office to clarify directions.  Message sent to Richarda Blade, NP

## 2022-11-23 ENCOUNTER — Telehealth: Payer: Self-pay

## 2022-11-23 NOTE — Telephone Encounter (Signed)
No paperwork received from the dentist.

## 2022-11-23 NOTE — Telephone Encounter (Signed)
Paper work completed and signed.given to admin.staff to fax as requested.

## 2022-11-23 NOTE — Telephone Encounter (Signed)
Paperwork received and placed in Dinah's time sensitive folder for review (last ov printed and attached)

## 2022-11-23 NOTE — Telephone Encounter (Signed)
I called the dentist and requested that paperwork be re-submit. It appeared they had the wrong fax number.  Outgoing call placed to patient to inform her of the above, however she did not answer and has a voicemail that has not been set up.

## 2022-11-23 NOTE — Telephone Encounter (Signed)
[facility] [employer]  You; Ngetich, Dinah C, NP3 minutes ago (2:53 PM)   TL Faxed [phone]) AND emailed ([email or web address]) form for Medical Consent for Dental Treatment completed by Dinah and office note from 11/11/22.

## 2022-11-23 NOTE — Telephone Encounter (Signed)
Patient called to say her dentist faxed [place] some paperwork to sign off on and she just got off the phone with them and they have not received it back.  Patients Dentist is [facility], phone [phone]  Please advise if you have seen paperwork

## 2022-11-25 ENCOUNTER — Encounter: Payer: Self-pay | Admitting: Family

## 2022-11-25 ENCOUNTER — Ambulatory Visit: Payer: 59 | Admitting: Family

## 2022-11-25 VITALS — BP 110/80 | HR 88 | Temp 98.1°F | Resp 18 | Ht 65.0 in | Wt 182.0 lb

## 2022-11-25 DIAGNOSIS — F17209 Nicotine dependence, unspecified, with unspecified nicotine-induced disorders: Secondary | ICD-10-CM | POA: Diagnosis not present

## 2022-11-25 DIAGNOSIS — Z01818 Encounter for other preprocedural examination: Secondary | ICD-10-CM | POA: Diagnosis not present

## 2022-11-25 MED ORDER — NICOTINE 21-14-7 MG/24HR TD KIT
PACK | TRANSDERMAL | 0 refills | Status: DC
Start: 2022-11-25 — End: 2023-09-09

## 2022-11-25 NOTE — Progress Notes (Signed)
Provider: Richarda Blade George  Michele George, Michele George, Michele George  Patient Care Team: Michele George, Michele George, Michele George as PCP - General (Family Medicine)  Extended Emergency Contact Information Primary Emergency Contact: Michele George Address: [address]          [place], [place] [address] [place] Home Phone: [phone] Mobile Phone: [phone] Relation: Mother Secondary Emergency Contact: Michele George Address: [address]          [place], [place] [address] [place] Phone: [phone] Relation: Daughter  Code Status: Full Code  Goals of care: Advanced Directive information    11/10/2022    1:13 PM  Advanced Directives  Does Patient Have a Medical Advance Directive? No  Would patient like information on creating a medical advance directive? No - Patient declined     Chief Complaint  Patient presents with   Acute Visit    Dental clearance for dental surgery     HPI:  Pt is a 62 y.o. female seen today for an acute visit for clearance for dental surgery at [facility] on [address] [name]  Tele# [phone]   States continues to smoke cigarettes.Request nicotine patch to assist in smoking cessation.   Past Medical History:  Diagnosis Date   Hypertension    Past Surgical History:  Procedure Laterality Date   COLONOSCOPY     TOTAL ABDOMINAL HYSTERECTOMY  2012    No Known Allergies  Outpatient Encounter Medications as of 11/25/2022  Medication Sig   amLODipine (NORVASC) 5 MG tablet Take 1 tablet (5 mg total) by mouth daily.   atorvastatin (LIPITOR) 10 MG tablet Take 1 tablet (10 mg total) by mouth daily.   metoprolol tartrate (LOPRESSOR) 50 MG tablet Take 1 tablet (50 mg total) by mouth 2 (two) times daily.   Nicotine 21-14-7 MG/24HR KIT Use as directed Stop smoking upon starting treatment. Apply 21 mg patch daily x 6 weeks then  14 mg patch daily x 2 weeks then  7 mg patch daily x 2 weeks and stop.    [DISCONTINUED] Nicotine 21-14-7 MG/24HR KIT Use as directed Stop smoking upon starting treatment. Apply 21 mg patch daily x 6 weeks then  14 mg patch daily x 2 weeks then  7 mg patch daily x 2 weeks and stop. (Patient not taking: Reported on 11/25/2022)   No facility-administered encounter medications on file as of 11/25/2022.    Review of Systems  Constitutional:  Negative for appetite change, chills, fatigue, fever and unexpected weight change.  HENT:  Positive for dental problem. Negative for congestion, ear discharge, ear pain, facial swelling, hearing loss, nosebleeds, postnasal drip, rhinorrhea, sinus pressure, sinus pain, sneezing, sore throat, tinnitus and trouble swallowing.        Requires surgical clearance for tooth extraction.   Eyes:  Negative for pain, discharge, redness, itching and visual disturbance.  Respiratory:  Negative for cough, chest tightness, shortness of breath and wheezing.   Cardiovascular:  Negative for chest pain, palpitations and leg swelling.  Gastrointestinal:  Negative for abdominal distention, abdominal pain, blood in stool, constipation, diarrhea, nausea and vomiting.  Endocrine: Negative for cold intolerance, heat intolerance, polydipsia, polyphagia and polyuria.  Genitourinary:  Negative for difficulty urinating, dysuria, flank pain, frequency and urgency.  Musculoskeletal:  Positive for gait problem. Negative for arthralgias, back pain, joint swelling, myalgias, neck pain and neck stiffness.  Skin:  Negative for color change, pallor, rash and wound.  Neurological:  Negative for  dizziness, syncope, speech difficulty, weakness, light-headedness, numbness and headaches.  Hematological:  Does not bruise/bleed easily.  Psychiatric/Behavioral:  Negative for agitation, behavioral problems, confusion, hallucinations, self-injury, sleep disturbance and suicidal ideas. The patient is not nervous/anxious.     Immunization History  Administered Date(s) Administered    DTaP 05/28/1965, 07/09/1965, 08/13/1965, 09/09/1966   IPV 05/28/1965, 07/09/1965, 08/13/1965, 08/09/1978   MODERNA SARS COV-2 Pediatric Vaccination 6mos to <68yrs 08/01/2019, 08/29/2019   Measles 07/02/1965   Moderna Sars-Covid-2 Vaccination 08/01/2019, 08/29/2019   PFIZER(Purple Top)SARS-COV-2 Vaccination 06/16/2021   Rubella 05/28/1968   Smallpox 07/09/1965   Td 11/07/2020   Tdap 11/07/2020   Zoster Recombinant(Shingrix) 07/25/2021, 10/01/2021   Pertinent  Health Maintenance Due  Topic Date Due   MAMMOGRAM  02/02/2013   INFLUENZA VACCINE  11/25/2022   PAP SMEAR-Modifier  06/05/2024   Colonoscopy  09/19/2031      07/09/2021   10:28 AM 07/24/2021    9:49 AM 09/30/2021    9:44 AM 03/31/2022    8:29 AM 11/10/2022    1:13 PM  Fall Risk  Falls in the past year? 0 1 0 0 0  Was there an injury with Fall? 0 1 0 0 0  Fall Risk Category Calculator 0 2 0 0 0  Fall Risk Category (Retired) Low Moderate Low Low   (RETIRED) Patient Fall Risk Level Low fall risk Moderate fall risk Low fall risk Low fall risk   Patient at Risk for Falls Due to No Fall Risks History of fall(s) No Fall Risks No Fall Risks No Fall Risks  Fall risk Follow up Falls evaluation completed Falls evaluation completed;Education provided;Falls prevention discussed Falls evaluation completed Falls evaluation completed Falls evaluation completed   Functional Status Survey:    Vitals:   11/25/22 1404  BP: 110/80  Pulse: 88  Resp: 18  Temp: 98.1 F (36.7 C)  SpO2: 94%  Weight: 182 lb (82.6 kg)  Height: 5\' 5"  (1.651 m)   Body mass index is 30.29 kg/m. Physical Exam Vitals reviewed.  Constitutional:      General: She is not in acute distress.    Appearance: Normal appearance. She is obese. She is not ill-appearing or diaphoretic.  HENT:     Head: Normocephalic.     Right Ear: Tympanic membrane, ear canal and external ear normal. There is no impacted cerumen.     Left Ear: Tympanic membrane, ear canal and  external ear normal. There is no impacted cerumen.     Nose: Nose normal. No congestion or rhinorrhea.     Mouth/Throat:     Mouth: Mucous membranes are moist.     Pharynx: Oropharynx is clear. No oropharyngeal exudate or posterior oropharyngeal erythema.     Comments: Broken rotten teeth with several missing. Eyes:     General: No scleral icterus.       Right eye: No discharge.        Left eye: No discharge.     Extraocular Movements: Extraocular movements intact.     Conjunctiva/sclera: Conjunctivae normal.     Pupils: Pupils are equal, round, and reactive to light.  Neck:     Vascular: No carotid bruit.  Cardiovascular:     Rate and Rhythm: Normal rate and regular rhythm.     Pulses: Normal pulses.     Heart sounds: Normal heart sounds. No murmur heard.    No friction rub. No gallop.  Pulmonary:     Effort: Pulmonary effort is normal. No respiratory distress.  Breath sounds: Normal breath sounds. No wheezing, rhonchi or rales.  Chest:     Chest wall: No tenderness.  Abdominal:     General: Bowel sounds are normal. There is no distension.     Palpations: Abdomen is soft. There is no mass.     Tenderness: There is no abdominal tenderness. There is no right CVA tenderness, left CVA tenderness, guarding or rebound.  Musculoskeletal:        General: No swelling or tenderness. Normal range of motion.     Cervical back: Normal range of motion. No rigidity or tenderness.     Right lower leg: No edema.     Left lower leg: No edema.  Lymphadenopathy:     Cervical: No cervical adenopathy.  Skin:    General: Skin is warm and dry.     Coloration: Skin is not pale.     Findings: No bruising, erythema, lesion or rash.  Neurological:     Mental Status: She is alert and oriented to person, place, and time.     Cranial Nerves: No cranial nerve deficit.     Sensory: No sensory deficit.     Motor: No weakness.     Coordination: Coordination normal.     Gait: Gait normal.   Psychiatric:        Mood and Affect: Mood normal.        Speech: Speech normal.        Behavior: Behavior normal.        Thought Content: Thought content normal.        Judgment: Judgment normal.     Labs reviewed: Recent Labs    11/09/22 1317 11/09/22 1324  NA 138 140  K 3.6 3.8  CL 105 104  CO2 25  --   GLUCOSE 153* 152*  BUN 6 6  CREATININE 0.91 0.90  CALCIUM 9.3  --    Recent Labs    11/09/22 1317  AST 16  ALT 17  ALKPHOS 100  BILITOT 0.6  PROT 6.8  ALBUMIN 3.4*   Recent Labs    11/09/22 1317 11/09/22 1324  WBC 7.8  --   NEUTROABS 5.0  --   HGB 14.2 14.6  HCT 42.7 43.0  MCV 94.3  --   PLT 312  --    Lab Results  Component Value Date   TSH 1.07 11/11/2022   Lab Results  Component Value Date   HGBA1C 6.0 (H) 11/11/2022   Lab Results  Component Value Date   CHOL 197 11/11/2022   HDL 37 (L) 11/11/2022   LDLCALC 132 (H) 11/11/2022   TRIG 152 (H) 11/11/2022   CHOLHDL 5.3 (H) 11/11/2022    Significant Diagnostic Results in last 30 days:  MR BRAIN WO CONTRAST  Result Date: 11/09/2022 CLINICAL DATA:  Stroke, follow up EXAM: MRI HEAD WITHOUT CONTRAST TECHNIQUE: Multiplanar, multiecho pulse sequences of the brain and surrounding structures were obtained without intravenous contrast. COMPARISON:  CT Head 10/10/22 FINDINGS: Brain: Negative for acute infarct. No hydrocephalus. No extra-axial fluid collection. There are chronic infarcts in the bilateral basal ganglia and thalami. There is sequela of moderate chronic microvascular ischemic change. There are innumerable small microhemorrhages predominantly in a central location. There are two lobar microhemorrhages in the bilateral frontal lobes (series 11, image 32, 35) Vascular: Normal flow voids. Skull and upper cervical spine: Normal marrow signal. Sinuses/Orbits: Middle ear or mastoid effusion. Paranasal sinuses are notable for mucosal thickening in the bilateral ethmoid sinuses and left maxillary  sinus.  Orbits are unremarkable. Other: None. IMPRESSION: 1. No acute intracranial process. 2. Innumerable small microhemorrhages predominantly in a central location, favored to be hypertensive in nature. 3. 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 Electronically Signed   By: Lorenza Cambridge M.D.   On: 11/09/2022 16:52   CT Head Wo Contrast  Result Date: 11/09/2022 CLINICAL DATA:  Neuro deficit, acute, stroke suspected EXAM: CT HEAD WITHOUT CONTRAST TECHNIQUE: Contiguous axial images were obtained from the base of the skull through the vertex without intravenous contrast. RADIATION DOSE REDUCTION: This exam was performed according to the departmental dose-optimization program which includes automated exposure control, adjustment of the mA and/or kV according to patient size and/or use of iterative reconstruction technique. COMPARISON:  None Available. FINDINGS: Brain: There is an age indeterminate right thalamic infarct (series 3, image 16). No evidence of hemorrhage, hydrocephalus, extra-axial collection or mass lesion/mass effect. Sequela of moderate chronic microvascular ischemic change. Vascular: No hyperdense vessel or unexpected calcification. Skull: Normal. Negative for fracture or focal lesion. Sinuses/Orbits: No middle ear or mastoid effusion. Paranasal sinuses are notable for mucosal thickening in the left maxillary sinus with osseous changes suggestive of chronic left maxillary sinusitis. Orbits are unremarkable. Other: None. IMPRESSION: Age-indeterminate right thalamic infarct. If there is clinical concern for an acute infarct, consider further evaluation with MRI. Electronically Signed   By: Lorenza Cambridge M.D.   On: 11/09/2022 15:03    Assessment/Plan 1. Tobacco use disorder, continuous - smoking cessation advised willing to quit request nicotine patch as below. - Nicotine 21-14-7 MG/24HR KIT; Use as directed Stop smoking upon starting treatment. Apply 21 mg patch daily x 6 weeks  then  14 mg patch daily x 2 weeks then  7 mg patch daily x 2 weeks and stop.  Dispense: 1 kit; Refill: 0  2. Pre-operative clearance She is scheduled for tooth extraction no signs of infection noted.  She is cleared for tooth extraction.  Not on aspirin or anticoagulant.  Family/ staff Communication: Reviewed plan of care with patient verbalized understanding  Labs/tests ordered: None   Next Appointment: Return if symptoms worsen or fail to improve.   Michele Bookman, Michele George

## 2023-01-05 ENCOUNTER — Other Ambulatory Visit: Payer: Self-pay

## 2023-01-05 ENCOUNTER — Encounter (HOSPITAL_COMMUNITY): Payer: Self-pay | Admitting: *Deleted

## 2023-01-05 ENCOUNTER — Ambulatory Visit (HOSPITAL_COMMUNITY)
Admission: EM | Admit: 2023-01-05 | Discharge: 2023-01-05 | Disposition: A | Discharge status: Home / Self Care | Payer: 59 | Attending: Emergency Medicine | Admitting: Emergency Medicine

## 2023-01-05 DIAGNOSIS — B9789 Other viral agents as the cause of diseases classified elsewhere: Secondary | ICD-10-CM | POA: Diagnosis not present

## 2023-01-05 DIAGNOSIS — I10 Essential (primary) hypertension: Secondary | ICD-10-CM | POA: Diagnosis not present

## 2023-01-05 DIAGNOSIS — Z1152 Encounter for screening for COVID-19: Secondary | ICD-10-CM | POA: Insufficient documentation

## 2023-01-05 DIAGNOSIS — J069 Acute upper respiratory infection, unspecified: Secondary | ICD-10-CM | POA: Insufficient documentation

## 2023-01-05 DIAGNOSIS — R059 Cough, unspecified: Secondary | ICD-10-CM | POA: Diagnosis not present

## 2023-01-05 MED ORDER — ACETAMINOPHEN 500 MG PO TABS: 500.0000 mg | ORAL_TABLET | Freq: Four times a day (QID) | ORAL | 0 refills | Status: AC | PRN

## 2023-01-05 MED ORDER — BENZONATATE 100 MG PO CAPS: 100.0000 mg | ORAL_CAPSULE | Freq: Three times a day (TID) | ORAL | 0 refills | Status: DC

## 2023-01-05 NOTE — ED Provider Notes (Signed)
[facility]    CSN: [id number] Arrival date & time: 01/05/23  1454      History   Chief Complaint Chief Complaint  Patient presents with   Cough   Sore Throat   Headache    HPI Michele George is a 62 y.o. female.   Patient presents to clinic complaining of headache, cough, sore throat, subjective fever and feeling unwell for the past 2 days.  Reports cough is productive with phlegm.  Denies any shortness of breath or current chest pain.  She has been taking Tylenol for her symptoms.  Reports she has not yet taken her blood pressure medication today, she does normally take it in the morning. Reports overall compliance with blood pressure medications.     The history is provided by the patient and medical records.  Cough Associated symptoms: fever and headaches   Associated symptoms: no chest pain and no shortness of breath   Sore Throat Associated symptoms include headaches. Pertinent negatives include no chest pain, no abdominal pain and no shortness of breath.  Headache Associated symptoms: cough and fever   Associated symptoms: no abdominal pain     Past Medical History:  Diagnosis Date   Hypertension     Patient Active Problem List   Diagnosis Date Noted   Hyperlipidemia LDL goal <100 09/30/2021   Need for Tdap vaccination 09/30/2021   Weakness of both lower extremities 09/30/2021   Essential hypertension 07/24/2021    Past Surgical History:  Procedure Laterality Date   COLONOSCOPY     TOTAL ABDOMINAL HYSTERECTOMY  2012    OB History   No obstetric history on file.      Home Medications    Prior to Admission medications   Medication Sig Start Date End Date Taking? Authorizing Provider  acetaminophen (TYLENOL) 500 MG tablet Take 1 tablet (500 mg total) by mouth every 6 (six) hours as needed. 01/05/23  Yes Rinaldo Ratel, [place] N, FNP  amLODipine (NORVASC) 5 MG tablet Take 1 tablet (5 mg total) by mouth daily. 11/09/22 01/05/23 Yes Aberman,  Merla Riches, PA-C  atorvastatin (LIPITOR) 10 MG tablet Take 1 tablet (10 mg total) by mouth daily. 09/30/21  Yes Ngetich, Dinah C, NP  benzonatate (TESSALON) 100 MG capsule Take 1 capsule (100 mg total) by mouth every 8 (eight) hours. 01/05/23  Yes Rinaldo Ratel, [place] N, FNP  metoprolol tartrate (LOPRESSOR) 50 MG tablet Take 1 tablet (50 mg total) by mouth 2 (two) times daily. 11/09/22 01/05/23 Yes Mannie Stabile, PA-C  Nicotine 21-14-7 MG/24HR KIT Use as directed Stop smoking upon starting treatment. Apply 21 mg patch daily x 6 weeks then  14 mg patch daily x 2 weeks then  7 mg patch daily x 2 weeks and stop. 11/25/22   Ngetich, Donalee Citrin, NP    Family History Family History  Problem Relation Age of Onset   Diabetes Mother    Colon polyps Neg Hx    Colon cancer Neg Hx    Esophageal cancer Neg Hx    Rectal cancer Neg Hx    Stomach cancer Neg Hx    Crohn's disease Neg Hx     Social History Social History   Tobacco Use   Smoking status: Every Day    Current packs/day: 0.50    Average packs/day: 0.5 packs/day for 10.0 years (5.0 ttl pk-yrs)    Types: Cigarettes   Smokeless tobacco: Never  Vaping Use   Vaping status: Never Used  Substance Use Topics  Alcohol use: Never   Drug use: Never     Allergies   Patient has no known allergies.   Review of Systems Review of Systems  Constitutional:  Positive for fever.  Respiratory:  Positive for cough. Negative for shortness of breath.   Cardiovascular:  Negative for chest pain.  Gastrointestinal:  Negative for abdominal pain.  Neurological:  Positive for headaches.     Physical Exam Triage Vital Signs ED Triage Vitals  Encounter Vitals Group     BP 01/05/23 1516 (!) 147/102     Systolic BP Percentile --      Diastolic BP Percentile --      Pulse Rate 01/05/23 1516 (!) 120     Resp 01/05/23 1516 20     Temp 01/05/23 1516 98.6 F (37 C)     Temp src --      SpO2 01/05/23 1516 93 %     Weight --      Height --      Head  Circumference --      Peak Flow --      Pain Score 01/05/23 1513 0     Pain Loc --      Pain Education --      Exclude from Growth Chart --    No data found.  Updated Vital Signs BP (!) 147/102   Pulse (!) 120   Temp 98.6 F (37 C)   Resp 20   SpO2 93%   Visual Acuity Right Eye Distance:   Left Eye Distance:   Bilateral Distance:    Right Eye Near:   Left Eye Near:    Bilateral Near:     Physical Exam Vitals and nursing note reviewed.  Constitutional:      Appearance: She is well-developed.  HENT:     Head: Normocephalic and atraumatic.     Nose: No congestion or rhinorrhea.     Mouth/Throat:     Mouth: Mucous membranes are moist.     Pharynx: Uvula midline. Posterior oropharyngeal erythema present.     Tonsils: No tonsillar exudate or tonsillar abscesses. 0 on the right. 0 on the left.  Eyes:     Conjunctiva/sclera: Conjunctivae normal.  Cardiovascular:     Rate and Rhythm: Regular rhythm. Tachycardia present.     Heart sounds: Normal heart sounds. No murmur heard. Pulmonary:     Effort: Pulmonary effort is normal. No respiratory distress.     Breath sounds: Normal breath sounds. No wheezing.  Skin:    General: Skin is warm and dry.  Neurological:     General: No focal deficit present.     Mental Status: She is alert.  Psychiatric:        Mood and Affect: Mood normal.        Behavior: Behavior normal.      UC Treatments / Results  Labs (all labs ordered are listed, but only abnormal results are displayed) Labs Reviewed - No data to display  EKG   Radiology No results found.  Procedures Procedures (including critical care time)  Medications Ordered in UC Medications - No data to display  Initial Impression / Assessment and Plan / UC Course  I have reviewed the triage vital signs and the nursing notes.  Pertinent labs & imaging results that were available during my care of the patient were reviewed by me and considered in my medical decision  making (see chart for details).  Vitals and triage reviewed, patient is hemodynamically stable.  She is hypertensive and tachycardic, has not yet taken her amlodipine or metoprolol.  Tachycardia could be related to lack of beta-blocker.  Lungs are vesicular.  Suspect viral URI causing symptoms of cough, sore throat and subjective fever.  COVID-19 testing obtained.  Encourage compliance with blood pressure medications.  Plan of care, follow-up care and return precautions given, no questions at this time.     Final Clinical Impressions(s) / UC Diagnoses   Final diagnoses:  Viral URI with cough  Essential hypertension     Discharge Instructions      We have swabbed you for COVID-19 and our staff will contact you if the results are positive.  Your blood pressure was elevated today as well as your pulse, I believe this may be due to you not taking her blood pressure medication as prescribed.  Ensure you are taking your amlodipine daily and metoprolol twice daily.  Please take your blood pressure within 10 minutes of waking and within 10 minutes of going to bed, record these readings and bring this to your next primary care visit, ideally within the next week.  Please return to clinic or seek immediate care if you develop shortness of breath, chest pain, or no improvement over the next 5 to 7 days.      ED Prescriptions     Medication Sig Dispense Auth. Provider   benzonatate (TESSALON) 100 MG capsule Take 1 capsule (100 mg total) by mouth every 8 (eight) hours. 21 capsule Rinaldo Ratel, [place] N, [place]   acetaminophen (TYLENOL) 500 MG tablet Take 1 tablet (500 mg total) by mouth every 6 (six) hours as needed. 30 tablet Aijalon Demuro, [place], [place]      PDMP not reviewed this encounter.   Lucella Pommier, [place] N, [place] 01/05/23 1539

## 2023-01-05 NOTE — Discharge Instructions (Addendum)
We have swabbed you for COVID-19 and our staff will contact you if the results are positive.  Your blood pressure was elevated today as well as your pulse, I believe this may be due to you not taking her blood pressure medication as prescribed.  Ensure you are taking your amlodipine daily and metoprolol twice daily.  Please take your blood pressure within 10 minutes of waking and within 10 minutes of going to bed, record these readings and bring this to your next primary care visit, ideally within the next week.  Please return to clinic or seek immediate care if you develop shortness of breath, chest pain, or no improvement over the next 5 to 7 days.

## 2023-01-05 NOTE — ED Triage Notes (Signed)
Pt reports having a HA,cough and sore throat for 2 days. Pt wants a COVID test.

## 2023-01-06 LAB — SARS CORONAVIRUS 2 (TAT 6-24 HRS): SARS Coronavirus 2: NEGATIVE

## 2023-02-17 ENCOUNTER — Other Ambulatory Visit: Payer: Self-pay | Admitting: Family

## 2023-02-17 DIAGNOSIS — Z1231 Encounter for screening mammogram for malignant neoplasm of breast: Secondary | ICD-10-CM

## 2023-03-01 ENCOUNTER — Ambulatory Visit (INDEPENDENT_AMBULATORY_CARE_PROVIDER_SITE_OTHER): Payer: 59 | Admitting: Family

## 2023-03-01 ENCOUNTER — Encounter: Payer: Self-pay | Admitting: Family

## 2023-03-01 VITALS — BP 140/90 | HR 88 | Temp 97.1°F | Resp 18 | Ht 65.0 in | Wt 186.4 lb

## 2023-03-01 DIAGNOSIS — R7303 Prediabetes: Secondary | ICD-10-CM

## 2023-03-01 DIAGNOSIS — I10 Essential (primary) hypertension: Secondary | ICD-10-CM | POA: Diagnosis not present

## 2023-03-01 DIAGNOSIS — E785 Hyperlipidemia, unspecified: Secondary | ICD-10-CM

## 2023-03-01 DIAGNOSIS — R296 Repeated falls: Secondary | ICD-10-CM

## 2023-03-01 DIAGNOSIS — Z23 Encounter for immunization: Secondary | ICD-10-CM | POA: Diagnosis not present

## 2023-03-01 DIAGNOSIS — R531 Weakness: Secondary | ICD-10-CM

## 2023-03-01 MED ORDER — METOPROLOL TARTRATE 50 MG PO TABS
50.0000 mg | ORAL_TABLET | Freq: Two times a day (BID) | ORAL | 1 refills | Status: AC
Start: 2023-03-01 — End: 2024-03-12

## 2023-03-01 MED ORDER — ATORVASTATIN CALCIUM 10 MG PO TABS
10.0000 mg | ORAL_TABLET | Freq: Every day | ORAL | 3 refills | Status: DC
Start: 2023-03-01 — End: 2023-03-10

## 2023-03-01 MED ORDER — AMLODIPINE BESYLATE 5 MG PO TABS
5.0000 mg | ORAL_TABLET | Freq: Every day | ORAL | 1 refills | Status: DC
Start: 2023-03-01 — End: 2023-09-09

## 2023-03-01 NOTE — Progress Notes (Signed)
Provider: Richarda Blade FNP-C   Rejina Odle, Donalee Citrin, NP  Patient Care Team: Kailin Leu, Donalee Citrin, NP as PCP - General (Family Medicine)  Extended Emergency Contact Information Primary Emergency Contact: FOUST,PATRICIA Address: [address]          [place], [place] [address] [place] Home Phone: [phone] Mobile Phone: [phone] Relation: Mother Secondary Emergency Contact: Charma Igo Address: [address]          [place], [place] [address] [place] Phone: [phone] Relation: Daughter  Code Status:  Full Code  Goals of care: Advanced Directive information    03/01/2023   12:50 PM  Advanced Directives  Does Patient Have a Medical Advance Directive? No  Would patient like information on creating a medical advance directive? No - Patient declined     Chief Complaint  Patient presents with   Medical Management of Chronic Issues    3 month follow up need to discuss mammogram and covid and flu vaccines.    HPI:  Pt is a 62 y.o. female seen today for 3 months follow up for  medical management of chronic diseases.  States has been out of her blood pressure medication for the past 3 days.she denies any headache,dizziness,vision changes,fatigue,chest tightness,palpitation,chest pain or shortness of breath.   Has upcoming appointment to get teeth extracted.  She complains of generalized leg weakness.    Has had x 2 fall episode since last visit.Last episode 2 weeks ago coming down the stairs.States legs gave out.  She denies any any signs of UTI    Past Medical History:  Diagnosis Date   Hypertension    Past Surgical History:  Procedure Laterality Date   COLONOSCOPY     TOTAL ABDOMINAL HYSTERECTOMY  2012    No Known Allergies  Allergies as of 03/01/2023   No Known Allergies      Medication List        Accurate as of March 01, 2023  1:10 PM. If you have any questions, ask your nurse or doctor.           acetaminophen 500 MG tablet Commonly known as: TYLENOL Take 1 tablet (500 mg total) by mouth every 6 (six) hours as needed.   amLODipine 5 MG tablet Commonly known as: NORVASC Take 1 tablet (5 mg total) by mouth daily.   atorvastatin 10 MG tablet Commonly known as: LIPITOR Take 1 tablet (10 mg total) by mouth daily.   benzonatate 100 MG capsule Commonly known as: TESSALON Take 1 capsule (100 mg total) by mouth every 8 (eight) hours.   metoprolol tartrate 50 MG tablet Commonly known as: LOPRESSOR Take 1 tablet (50 mg total) by mouth 2 (two) times daily.   Nicotine 21-14-7 MG/24HR Kit Use as directed Stop smoking upon starting treatment. Apply 21 mg patch daily x 6 weeks then  14 mg patch daily x 2 weeks then  7 mg patch daily x 2 weeks and stop.        Review of Systems  Constitutional:  Negative for appetite change, chills, fatigue, fever and unexpected weight change.  HENT:  Positive for dental problem. Negative for congestion, ear discharge, ear pain, facial swelling, hearing loss, nosebleeds, postnasal drip, rhinorrhea, sinus pressure, sinus pain, sneezing, sore throat, tinnitus and trouble swallowing.   Eyes:  Negative for pain, discharge, redness, itching and visual disturbance.  Respiratory:  Negative for cough, chest tightness, shortness of breath and wheezing.   Cardiovascular:  Negative for  chest pain, palpitations and leg swelling.  Gastrointestinal:  Negative for abdominal distention, abdominal pain, blood in stool, constipation, diarrhea, nausea and vomiting.  Endocrine: Negative for cold intolerance, heat intolerance, polydipsia, polyphagia and polyuria.  Genitourinary:  Negative for difficulty urinating, dysuria, flank pain, frequency and urgency.  Musculoskeletal:  Negative for arthralgias, back pain, gait problem, joint swelling, myalgias, neck pain and neck stiffness.       Legs give out  X 2 fall episodes since last visit   Skin:  Negative for color  change, pallor, rash and wound.  Neurological:  Negative for dizziness, syncope, speech difficulty, weakness, light-headedness, numbness and headaches.  Hematological:  Does not bruise/bleed easily.  Psychiatric/Behavioral:  Negative for agitation, behavioral problems, confusion, hallucinations and sleep disturbance. The patient is not nervous/anxious.     Immunization History  Administered Date(s) Administered   DTaP 05/28/1965, 07/09/1965, 08/13/1965, 09/09/1966   IPV 05/28/1965, 07/09/1965, 08/13/1965, 08/09/1978   MODERNA SARS COV-2 Pediatric Vaccination 6mos to <19yrs 08/01/2019, 08/29/2019   Measles 07/02/1965   Moderna Sars-Covid-2 Vaccination 08/01/2019, 08/29/2019   PFIZER(Purple Top)SARS-COV-2 Vaccination 06/16/2021   Rubella 05/28/1968   Smallpox 07/09/1965   Td 11/07/2020   Tdap 11/07/2020   Zoster Recombinant(Shingrix) 07/25/2021, 10/01/2021   Pertinent  Health Maintenance Due  Topic Date Due   MAMMOGRAM  02/02/2013   INFLUENZA VACCINE  Never done   Colonoscopy  09/19/2031      07/24/2021    9:49 AM 09/30/2021    9:44 AM 03/31/2022    8:29 AM 11/10/2022    1:13 PM 03/01/2023   12:46 PM  Fall Risk  Falls in the past year? 1 0 0 0 1  Was there an injury with Fall? 1 0 0 0 0  Fall Risk Category Calculator 2 0 0 0 2  Fall Risk Category (Retired) Moderate Low Low    (RETIRED) Patient Fall Risk Level Moderate fall risk Low fall risk Low fall risk    Patient at Risk for Falls Due to History of fall(s) No Fall Risks No Fall Risks No Fall Risks History of fall(s)  Fall risk Follow up Falls evaluation completed;Education provided;Falls prevention discussed Falls evaluation completed Falls evaluation completed Falls evaluation completed    Functional Status Survey:    Vitals:   03/01/23 1254 03/01/23 1258  BP: (!) 152/100 (!) 140/90  Pulse: 88   Resp: 18   Temp: (!) 97.1 F (36.2 C)   SpO2: 99%   Weight: 186 lb 6.4 oz (84.6 kg)   Height: 5\' 5"  (1.651 m)    Body mass  index is 31.02 kg/m. Physical Exam Vitals reviewed.  Constitutional:      General: She is not in acute distress.    Appearance: Normal appearance. She is obese. She is not ill-appearing or diaphoretic.  HENT:     Head: Normocephalic.     Right Ear: Tympanic membrane, ear canal and external ear normal. There is no impacted cerumen.     Left Ear: Tympanic membrane, ear canal and external ear normal. There is no impacted cerumen.     Nose: Nose normal. No congestion or rhinorrhea.     Mouth/Throat:     Mouth: Mucous membranes are moist.     Dentition: Dental caries present.     Pharynx: Oropharynx is clear. No oropharyngeal exudate or posterior oropharyngeal erythema.     Comments: Several teeth missing  Eyes:     General: No scleral icterus.       Right eye: No discharge.  Left eye: No discharge.     Extraocular Movements: Extraocular movements intact.     Conjunctiva/sclera: Conjunctivae normal.     Pupils: Pupils are equal, round, and reactive to light.  Neck:     Vascular: No carotid bruit.  Cardiovascular:     Rate and Rhythm: Normal rate and regular rhythm.     Pulses: Normal pulses.     Heart sounds: Normal heart sounds. No murmur heard.    No friction rub. No gallop.  Pulmonary:     Effort: Pulmonary effort is normal. No respiratory distress.     Breath sounds: Normal breath sounds. No wheezing, rhonchi or rales.  Chest:     Chest wall: No tenderness.  Abdominal:     General: Bowel sounds are normal. There is no distension.     Palpations: Abdomen is soft. There is no mass.     Tenderness: There is no abdominal tenderness. There is no right CVA tenderness, left CVA tenderness, guarding or rebound.  Musculoskeletal:        General: No swelling or tenderness. Normal range of motion.     Cervical back: Normal range of motion. No rigidity or tenderness.     Right lower leg: No edema.     Left lower leg: No edema.  Lymphadenopathy:     Cervical: No cervical  adenopathy.  Skin:    General: Skin is warm and dry.     Coloration: Skin is not pale.     Findings: No bruising, erythema, lesion or rash.  Neurological:     Mental Status: She is alert and oriented to person, place, and time.     Cranial Nerves: No cranial nerve deficit.     Sensory: No sensory deficit.     Motor: No weakness.     Coordination: Coordination normal.     Gait: Gait normal.  Psychiatric:        Mood and Affect: Mood normal.        Speech: Speech normal.        Behavior: Behavior normal.        Thought Content: Thought content normal.        Judgment: Judgment normal.     Labs reviewed: Recent Labs    11/09/22 1317 11/09/22 1324  NA 138 140  K 3.6 3.8  CL 105 104  CO2 25  --   GLUCOSE 153* 152*  BUN 6 6  CREATININE 0.91 0.90  CALCIUM 9.3  --    Recent Labs    11/09/22 1317  AST 16  ALT 17  ALKPHOS 100  BILITOT 0.6  PROT 6.8  ALBUMIN 3.4*   Recent Labs    11/09/22 1317 11/09/22 1324  WBC 7.8  --   NEUTROABS 5.0  --   HGB 14.2 14.6  HCT 42.7 43.0  MCV 94.3  --   PLT 312  --    Lab Results  Component Value Date   TSH 1.07 11/11/2022   Lab Results  Component Value Date   HGBA1C 6.0 (H) 11/11/2022   Lab Results  Component Value Date   CHOL 197 11/11/2022   HDL 37 (L) 11/11/2022   LDLCALC 132 (H) 11/11/2022   TRIG 152 (H) 11/11/2022   CHOLHDL 5.3 (H) 11/11/2022    Significant Diagnostic Results in last 30 days:  No results found.  Assessment/Plan 1. Need for influenza vaccination Afebrile  - Flu shot administered by CMA no acute reaction reported.  - Flu vaccine trivalent PF,  6mos and older(Flulaval,Afluria,Fluarix,Fluzone)  2. Uncontrolled hypertension B/p elevated  -Start on amlodipine 5 mg tablet daily. -Will follow-up in 2 weeks to recheck blood pressure - Advised to check Blood pressure at home and record on log provided and notify provider if B/p > 140/90 -Continue on metoprolol - TSH - COMPLETE METABOLIC PANEL  WITH GFR - CBC with Differential/Platelet - amLODipine (NORVASC) 5 MG tablet; Take 1 tablet (5 mg total) by mouth daily.  Dispense: 90 tablet; Refill: 1 - metoprolol tartrate (LOPRESSOR) 50 MG tablet; Take 1 tablet (50 mg total) by mouth 2 (two) times daily.  Dispense: 180 tablet; Refill: 1 - Ambulatory referral to Physical Therapy  3. Hyperlipidemia LDL goal <100 LDL not at goal  - Recommend a low saturated fats, low carbohydrates and high vegetable diet also exercise at least 3 times per week for 30 minutes. - Lipid panel  4.Prediabetes Lab Results  Component Value Date   HGBA1C 6.0 (H) 11/11/2022  Dietary modification and exercise as above - Hemoglobin A1c  6. Generalized weakness Legs tend to give out s/p fall episode while walking down the steps due to legs giving out  - Ambulatory referral to Physical Therapy  7. Falling episodes Has had x 2 episode since last visit  - Fall and safety precaution advised  - Ambulatory referral to Physical Therapy  Family/ staff Communication: Reviewed plan of care with patient verbalized understanding  Labs/tests ordered:  - TSH - COMPLETE METABOLIC PANEL WITH GFR - CBC with Differential/Platelet - Hemoglobin A1c - Lipid panel  Next Appointment : Return in about 2 weeks (around 03/15/2023) for Blood pressure.   Caesar Bookman, NP

## 2023-03-02 LAB — LIPID PANEL
Cholesterol: 196 mg/dL (ref <200)
HDL: 44 mg/dL — ABNORMAL LOW (ref >=50)
LDL Cholesterol (Calc): 125 mg/dL — ABNORMAL HIGH
Non-HDL Cholesterol (Calc): 152 mg/dL — ABNORMAL HIGH (ref <130)
Total CHOL/HDL Ratio: 4.5 (calc) (ref <5.0)
Triglycerides: 153 mg/dL — ABNORMAL HIGH (ref <150)

## 2023-03-02 LAB — CBC WITH DIFFERENTIAL/PLATELET
Absolute Lymphocytes: 2113 {cells}/uL (ref 850–3900)
Absolute Monocytes: 520 {cells}/uL (ref 200–950)
Basophils Absolute: 111 {cells}/uL (ref 0–200)
Basophils Relative: 1.7 %
Eosinophils Absolute: 78 {cells}/uL (ref 15–500)
Eosinophils Relative: 1.2 %
HCT: 44.6 % (ref 35.0–45.0)
Hemoglobin: 14.3 g/dL (ref 11.7–15.5)
MCH: 30 pg (ref 27.0–33.0)
MCHC: 32.1 g/dL (ref 32.0–36.0)
MCV: 93.5 fL (ref 80.0–100.0)
MPV: 10.6 fL (ref 7.5–12.5)
Monocytes Relative: 8 %
Neutro Abs: 3679 {cells}/uL (ref 1500–7800)
Neutrophils Relative %: 56.6 %
Platelets: 368 10*3/uL (ref 140–400)
RBC: 4.77 10*6/uL (ref 3.80–5.10)
RDW: 12.4 % (ref 11.0–15.0)
Total Lymphocyte: 32.5 %
WBC: 6.5 10*3/uL (ref 3.8–10.8)

## 2023-03-02 LAB — COMPLETE METABOLIC PANEL WITH GFR
AG Ratio: 1.3 (calc) (ref 1.0–2.5)
ALT: 13 U/L (ref 6–29)
AST: 11 U/L (ref 10–35)
Albumin: 3.9 g/dL (ref 3.6–5.1)
Alkaline phosphatase (APISO): 113 U/L (ref 37–153)
BUN/Creatinine Ratio: 9 (calc) (ref 6–22)
BUN: 6 mg/dL — ABNORMAL LOW (ref 7–25)
CO2: 29 mmol/L (ref 20–32)
Calcium: 10 mg/dL (ref 8.6–10.4)
Chloride: 106 mmol/L (ref 98–110)
Creat: 0.7 mg/dL (ref 0.50–1.05)
Globulin: 3 g/dL (ref 1.9–3.7)
Glucose, Bld: 100 mg/dL — ABNORMAL HIGH (ref 65–99)
Potassium: 4.5 mmol/L (ref 3.5–5.3)
Sodium: 140 mmol/L (ref 135–146)
Total Bilirubin: 0.3 mg/dL (ref 0.2–1.2)
Total Protein: 6.9 g/dL (ref 6.1–8.1)
eGFR: 99 mL/min/{1.73_m2} (ref >=60)

## 2023-03-02 LAB — TSH: TSH: 1.36 m[IU]/L (ref 0.40–4.50)

## 2023-03-02 LAB — HEMOGLOBIN A1C
Hgb A1c MFr Bld: 5.8 %{Hb} — ABNORMAL HIGH (ref <5.7)
Mean Plasma Glucose: 120 mg/dL
eAG (mmol/L): 6.6 mmol/L

## 2023-03-07 ENCOUNTER — Encounter: Payer: Self-pay | Admitting: Family

## 2023-03-07 ENCOUNTER — Ambulatory Visit: Payer: Medicaid Other | Admitting: Family

## 2023-03-07 VITALS — BP 122/86 | HR 64 | Ht 65.0 in | Wt 183.6 lb

## 2023-03-07 DIAGNOSIS — I10 Essential (primary) hypertension: Secondary | ICD-10-CM | POA: Diagnosis not present

## 2023-03-07 NOTE — Progress Notes (Signed)
Provider: Richarda Blade FNP-C  Ryian Lynde, Donalee Citrin, NP  Patient Care Team: Godson Pollan, Donalee Citrin, NP as PCP - General (Family Medicine)  Extended Emergency Contact Information Primary Emergency Contact: FOUST,PATRICIA Address: [address]          [place], [place] [address] [place] Home Phone: [phone] Mobile Phone: [phone] Relation: Mother Secondary Emergency Contact: Charma Igo Address: [address]          [place], [place] [address] [place] of [place] Phone: [phone] Relation: Daughter  Code Status:  Full Code Goals of care: Advanced Directive information    03/01/2023   12:50 PM  Advanced Directives  Does Patient Have a Medical Advance Directive? No  Would patient like information on creating a medical advance directive? No - Patient declined     Chief Complaint  Patient presents with   Medical Management of Chronic Issues    Patient presents today for a 1 week follow-up    HPI:  Pt is a 62 y.o. female seen today for an acute visit for one week follow up for high blood pressure.states does not check blood pressure at home. She denies any headache,dizziness,vision changes,fatigue,chest tightness,palpitation,chest pain or shortness of breath.   Has upcoming appointment with dental for removal of several teeth then will get fitted for dentures.states paper work was supposed to be sent to PCP for surgical clearance.she is not on any Asprin or anticoagulant.     Past Medical History:  Diagnosis Date   Hypertension    Past Surgical History:  Procedure Laterality Date   COLONOSCOPY     TOTAL ABDOMINAL HYSTERECTOMY  2012    No Known Allergies  Outpatient Encounter Medications as of 03/07/2023  Medication Sig   amLODipine (NORVASC) 5 MG tablet Take 1 tablet (5 mg total) by mouth daily.   atorvastatin (LIPITOR) 10 MG tablet Take 1 tablet (10 mg total) by mouth daily.   metoprolol tartrate (LOPRESSOR) 50 MG tablet Take 1  tablet (50 mg total) by mouth 2 (two) times daily.   acetaminophen (TYLENOL) 500 MG tablet Take 1 tablet (500 mg total) by mouth every 6 (six) hours as needed. (Patient not taking: Reported on 03/01/2023)   benzonatate (TESSALON) 100 MG capsule Take 1 capsule (100 mg total) by mouth every 8 (eight) hours. (Patient not taking: Reported on 03/01/2023)   Nicotine 21-14-7 MG/24HR KIT Use as directed Stop smoking upon starting treatment. Apply 21 mg patch daily x 6 weeks then  14 mg patch daily x 2 weeks then  7 mg patch daily x 2 weeks and stop. (Patient not taking: Reported on 03/01/2023)   No facility-administered encounter medications on file as of 03/07/2023.    Review of Systems  Constitutional:  Negative for appetite change, chills, fatigue, fever and unexpected weight change.  HENT:  Positive for dental problem. Negative for congestion, ear discharge, ear pain, facial swelling, hearing loss, nosebleeds, postnasal drip, rhinorrhea, sinus pressure, sinus pain, sneezing, sore throat, tinnitus and trouble swallowing.   Eyes:  Negative for pain, discharge, redness, itching and visual disturbance.  Respiratory:  Negative for cough, chest tightness, shortness of breath and wheezing.   Cardiovascular:  Negative for chest pain, palpitations and leg swelling.  Gastrointestinal:  Negative for abdominal distention, abdominal pain, blood in stool, constipation, diarrhea, nausea and vomiting.  Genitourinary:  Negative for difficulty urinating, dysuria, flank pain, frequency and urgency.  Musculoskeletal:  Negative for arthralgias, back pain, gait problem, joint swelling, myalgias, neck pain and  neck stiffness.  Skin:  Negative for color change, pallor, rash and wound.  Neurological:  Negative for dizziness, syncope, speech difficulty, weakness, light-headedness, numbness and headaches.  Hematological:  Does not bruise/bleed easily.  Psychiatric/Behavioral:  Negative for agitation, behavioral problems,  confusion, hallucinations and sleep disturbance. The patient is not nervous/anxious.     Immunization History  Administered Date(s) Administered   DTaP 05/28/1965, 07/09/1965, 08/13/1965, 09/09/1966   IPV 05/28/1965, 07/09/1965, 08/13/1965, 08/09/1978   Influenza, Seasonal, Injecte, Preservative Fre 03/01/2023   MODERNA SARS COV-2 Pediatric Vaccination 6mos to <76yrs 08/01/2019, 08/29/2019   Measles 07/02/1965   Moderna Sars-Covid-2 Vaccination 08/01/2019, 08/29/2019   PFIZER(Purple Top)SARS-COV-2 Vaccination 06/16/2021   Rubella 05/28/1968   Smallpox 07/09/1965   Td 11/07/2020   Tdap 11/07/2020   Zoster Recombinant(Shingrix) 07/25/2021, 10/01/2021   Pertinent  Health Maintenance Due  Topic Date Due   MAMMOGRAM  02/02/2013   Colonoscopy  09/19/2031   INFLUENZA VACCINE  Completed      07/24/2021    9:49 AM 09/30/2021    9:44 AM 03/31/2022    8:29 AM 11/10/2022    1:13 PM 03/01/2023   12:46 PM  Fall Risk  Falls in the past year? 1 0 0 0 1  Was there an injury with Fall? 1 0 0 0 0  Fall Risk Category Calculator 2 0 0 0 2  Fall Risk Category (Retired) Moderate Low Low    (RETIRED) Patient Fall Risk Level Moderate fall risk Low fall risk Low fall risk    Patient at Risk for Falls Due to History of fall(s) No Fall Risks No Fall Risks No Fall Risks History of fall(s)  Fall risk Follow up Falls evaluation completed;Education provided;Falls prevention discussed Falls evaluation completed Falls evaluation completed Falls evaluation completed    Functional Status Survey:    Vitals:   03/07/23 1051  BP: 122/86  Pulse: 64  SpO2: 97%  Weight: 183 lb 9.6 oz (83.3 kg)  Height: 5\' 5"  (1.651 m)   Body mass index is 30.55 kg/m. Physical Exam Vitals reviewed.  Constitutional:      General: She is not in acute distress.    Appearance: Normal appearance. She is obese. She is not ill-appearing or diaphoretic.  HENT:     Head: Normocephalic.     Right Ear: Tympanic membrane, ear canal  and external ear normal. There is no impacted cerumen.     Left Ear: Tympanic membrane, ear canal and external ear normal. There is no impacted cerumen.     Nose: Nose normal. No congestion or rhinorrhea.     Mouth/Throat:     Mouth: Mucous membranes are moist.     Dentition: Dental caries present.     Pharynx: Oropharynx is clear. No oropharyngeal exudate or posterior oropharyngeal erythema.     Comments: Missing teeth  Eyes:     General: No scleral icterus.       Right eye: No discharge.        Left eye: No discharge.     Extraocular Movements: Extraocular movements intact.     Conjunctiva/sclera: Conjunctivae normal.     Pupils: Pupils are equal, round, and reactive to light.  Neck:     Vascular: No carotid bruit.  Cardiovascular:     Rate and Rhythm: Normal rate and regular rhythm.     Pulses: Normal pulses.     Heart sounds: Normal heart sounds. No murmur heard.    No friction rub. No gallop.  Pulmonary:  Effort: Pulmonary effort is normal. No respiratory distress.     Breath sounds: Normal breath sounds. No wheezing, rhonchi or rales.  Chest:     Chest wall: No tenderness.  Abdominal:     General: Bowel sounds are normal. There is no distension.     Palpations: Abdomen is soft. There is no mass.     Tenderness: There is no abdominal tenderness. There is no right CVA tenderness, left CVA tenderness, guarding or rebound.  Musculoskeletal:        General: No swelling or tenderness. Normal range of motion.     Cervical back: Normal range of motion. No rigidity or tenderness.     Right lower leg: No edema.     Left lower leg: No edema.  Lymphadenopathy:     Cervical: No cervical adenopathy.  Skin:    General: Skin is warm and dry.     Coloration: Skin is not pale.     Findings: No bruising, erythema, lesion or rash.  Neurological:     Mental Status: She is alert and oriented to person, place, and time.     Cranial Nerves: No cranial nerve deficit.     Sensory: No  sensory deficit.     Motor: No weakness.     Coordination: Coordination normal.     Gait: Gait normal.  Psychiatric:        Mood and Affect: Mood normal.        Speech: Speech normal.        Behavior: Behavior normal.        Thought Content: Thought content normal.        Judgment: Judgment normal.     Labs reviewed: Recent Labs    11/09/22 1317 11/09/22 1324 03/01/23 1347  NA 138 140 140  K 3.6 3.8 4.5  CL 105 104 106  CO2 25  --  29  GLUCOSE 153* 152* 100*  BUN 6 6 6*  CREATININE 0.91 0.90 0.70  CALCIUM 9.3  --  10.0   Recent Labs    11/09/22 1317 03/01/23 1347  AST 16 11  ALT 17 13  ALKPHOS 100  --   BILITOT 0.6 0.3  PROT 6.8 6.9  ALBUMIN 3.4*  --    Recent Labs    11/09/22 1317 11/09/22 1324 03/01/23 1347  WBC 7.8  --  6.5  NEUTROABS 5.0  --  3,679  HGB 14.2 14.6 14.3  HCT 42.7 43.0 44.6  MCV 94.3  --  93.5  PLT 312  --  368   Lab Results  Component Value Date   TSH 1.36 03/01/2023   Lab Results  Component Value Date   HGBA1C 5.8 (H) 03/01/2023   Lab Results  Component Value Date   CHOL 196 03/01/2023   HDL 44 (L) 03/01/2023   LDLCALC 125 (H) 03/01/2023   TRIG 153 (H) 03/01/2023   CHOLHDL 4.5 03/01/2023    Significant Diagnostic Results in last 30 days:  No results found.  Assessment/Plan  Essential hypertension Blood pressure well-controlled -Continue on amlodipine and metoprolol - EKG 12-Lead shows sinus rhythm heart rate 65 with nonspecific T abnormality no changes compared to previous EKG done 11/09/2022. - Lipid panel; Future - TSH; Future - COMPLETE METABOLIC PANEL WITH GFR; Future - CBC with Differential/Platelet; Future  Family/ staff Communication: Reviewed plan of care with patient verbalized understanding  Labs/tests ordered: - EKG 12-Lead  - Lipid panel; Future - TSH; Future - COMPLETE METABOLIC PANEL WITH GFR; Future -  CBC with Differential/Platelet; Future  Next Appointment: Return in about 6 months (around  09/04/2023) for medical mangement of chronic issues., Fasting labs in 6 months prior to visit.   Caesar Bookman, NP

## 2023-03-07 NOTE — Patient Instructions (Addendum)
[facility] [address], [place], [place] [address] [phone]

## 2023-03-08 ENCOUNTER — Other Ambulatory Visit: Payer: Self-pay

## 2023-03-08 DIAGNOSIS — I10 Essential (primary) hypertension: Secondary | ICD-10-CM

## 2023-03-08 DIAGNOSIS — E785 Hyperlipidemia, unspecified: Secondary | ICD-10-CM

## 2023-03-10 ENCOUNTER — Other Ambulatory Visit: Payer: Self-pay

## 2023-03-10 MED ORDER — ATORVASTATIN CALCIUM 20 MG PO TABS: 20.0000 mg | ORAL_TABLET | Freq: Every day | ORAL | 3 refills | Status: DC

## 2023-03-16 ENCOUNTER — Ambulatory Visit
Admission: RE | Admit: 2023-03-16 | Discharge: 2023-03-16 | Disposition: A | Discharge status: Home / Self Care | Payer: Medicaid Other | Source: Ambulatory Visit

## 2023-03-16 DIAGNOSIS — Z1231 Encounter for screening mammogram for malignant neoplasm of breast: Secondary | ICD-10-CM

## 2023-03-21 ENCOUNTER — Other Ambulatory Visit: Payer: Self-pay | Admitting: Family

## 2023-03-21 DIAGNOSIS — R928 Other abnormal and inconclusive findings on diagnostic imaging of breast: Secondary | ICD-10-CM

## 2023-03-22 ENCOUNTER — Other Ambulatory Visit: Payer: Self-pay

## 2023-03-22 ENCOUNTER — Ambulatory Visit: Payer: Medicaid Other | Attending: Family

## 2023-03-22 DIAGNOSIS — R2681 Unsteadiness on feet: Secondary | ICD-10-CM | POA: Insufficient documentation

## 2023-03-22 DIAGNOSIS — M6281 Muscle weakness (generalized): Secondary | ICD-10-CM | POA: Insufficient documentation

## 2023-03-22 DIAGNOSIS — I10 Essential (primary) hypertension: Secondary | ICD-10-CM | POA: Insufficient documentation

## 2023-03-22 DIAGNOSIS — R531 Weakness: Secondary | ICD-10-CM | POA: Diagnosis not present

## 2023-03-22 DIAGNOSIS — R2689 Other abnormalities of gait and mobility: Secondary | ICD-10-CM | POA: Insufficient documentation

## 2023-03-22 DIAGNOSIS — R296 Repeated falls: Secondary | ICD-10-CM | POA: Diagnosis not present

## 2023-03-22 NOTE — Therapy (Signed)
OUTPATIENT PHYSICAL THERAPY LOWER EXTREMITY EVALUATION   Patient Name: Michele George MRN: [id number] DOB:23-May-1960, 62 y.o., female Today's Date: 03/22/2023  END OF SESSION:  PT End of Session - 03/22/23 1251     Visit Number 1    Number of Visits 15    Date for PT Re-Evaluation 05/17/23    Authorization Type [place] [employer]    PT Start Time 1145    PT Stop Time 1225    PT Time Calculation (min) 40 min    Activity Tolerance Patient tolerated treatment well    Behavior During Therapy WFL for tasks assessed/performed             Past Medical History:  Diagnosis Date   Hypertension    Past Surgical History:  Procedure Laterality Date   COLONOSCOPY     TOTAL ABDOMINAL HYSTERECTOMY  2012   Patient Active Problem List   Diagnosis Date Noted   Hyperlipidemia LDL goal <100 09/30/2021   Need for Tdap vaccination 09/30/2021   Weakness of both lower extremities 09/30/2021   Essential hypertension 07/24/2021    PCP: Ngetich, Donalee Citrin, NP  REFERRING PROVIDER: Ngetich, Donalee Citrin, NP  REFERRING DIAG:  I10 (ICD-10-CM) - Uncontrolled hypertension R53.1 (ICD-10-CM) - Generalized weakness R29.6 (ICD-10-CM) - Falling episodes  THERAPY DIAG:  Muscle weakness (generalized)  Other abnormalities of gait and mobility  Unsteadiness on feet  Rationale for Evaluation and Treatment: Rehabilitation  ONSET DATE: Chronic  SUBJECTIVE:   SUBJECTIVE STATEMENT: Pt presents to PT with reports of few month history of decreasing activity tolerance and LE weakness. Has had two near falls while ambulating up/down stairs at home. Denies any pain but she is frustrated that she is able to do less than she used to. Notes mild strokes in the past, has recently had trouble controlling her BP. Feels like she can be on her feet less than 10 minutes right now.   PERTINENT HISTORY: HTN, mild CVA per patient  PAIN:  Are you having pain?  No: NPRS scale: 0/10  PRECAUTIONS: None  RED  FLAGS: None   WEIGHT BEARING RESTRICTIONS: No  FALLS:  Has patient fallen in last 6 months? No - has had two close calls   LIVING ENVIRONMENT: Lives with: lives alone Lives in: House/apartment Stairs: Yes: Internal: 12 steps; on right going up Has following equipment at home: Single point cane  OCCUPATION: Not working  PLOF: Independent  PATIENT GOALS: Pt would like to get her functional activity tolerance and strength back   NEXT MD VISIT: 09/09/2022  OBJECTIVE:  Note: Objective measures were completed at Evaluation unless otherwise noted.  DIAGNOSTIC FINDINGS: N/A  VITALS: BP: 1st - 139/108 (automatic) 2nd - 138/110 (automatic) 3rd: 140/110 (manual)    PATIENT SURVEYS:  FOTO: 43% function; 54% predicted  COGNITION: Overall cognitive status: Within functional limits for tasks assessed     SENSATION: WFL  POSTURE: rounded shoulders and forward head  PALPATION: No TTP noted  LOWER EXTREMITY MMT:  MMT Right eval Left eval  Hip flexion 3/5 3/5  Hip extension    Hip abduction 3/5 3/5  Hip adduction 3/5 3/5  Hip internal rotation    Hip external rotation    Knee flexion 4/5 4/5  Knee extension 4/5 4/5  Ankle dorsiflexion    Ankle plantarflexion    Ankle inversion    Ankle eversion     (Blank rows = not tested)  LOWER EXTREMITY SPECIAL TESTS:  DNT  FUNCTIONAL TESTS:  30 Second  Sit to Stand: 5 reps - with UE  GAIT: Distance walked: 45ft Assistive device utilized: Single point cane Level of assistance: Modified independence Comments: trunk flexed, decreased gait speed   TREATMENT: [facility] Adult PT Treatment:                                                DATE: 03/22/2023 Therapeutic Exercise: Supine SLR x 5 each Bridge x 5 Supine clamshell x 5 GTB  PATIENT EDUCATION:  Education details: eval findings, FOTO, HEP, POC Person educated: Patient Education method: Explanation, Demonstration, and Handouts Education comprehension: verbalized  understanding and returned demonstration  HOME EXERCISE PROGRAM: Access Code: [email or web address] URL: [email or web address] Date: 03/22/2023 Prepared by: Edwinna Areola  Exercises - Active Straight Leg Raise with Quad Set  - 1 x daily - 7 x weekly - 2 sets - 10 reps - Supine Bridge  - 1 x daily - 7 x weekly - 2 sets - 10 reps - Hooklying Clamshell with Resistance  - 1 x daily - 7 x weekly - 3 sets - 10 reps - green band hold vftjh   ASSESSMENT:  CLINICAL IMPRESSION: Patient is a 62 y.o. F who was seen today for physical therapy evaluation and treatment for chronic LE weakness and imbalance. Physical findings are consistent with referring provider as pt demonstrates decrease in LE MMT and functional mobility. FOTO score demonstrates decrease in subjective functional ability below PLOF. Of note, pt had elevated BP today as she had not yet taken her medication. PT advised pt to take medication at consistent times each day, will reach out to PCP to discuss. Pt would benefit from skilled PT services working on improving strength, balance, and mobility in order to increase safety.    OBJECTIVE IMPAIRMENTS: decreased activity tolerance, decreased balance, decreased mobility, difficulty walking, and decreased strength  ACTIVITY LIMITATIONS: carrying, lifting, standing, squatting, stairs, transfers, and locomotion level  PARTICIPATION LIMITATIONS: meal prep, cleaning, interpersonal relationship, driving, shopping, and community activity  PERSONAL FACTORS: Time since onset of injury/illness/exacerbation and 1-2 comorbidities: HTN, mild CVA per patient  are also affecting patient's functional outcome.   REHAB POTENTIAL: Good  CLINICAL DECISION MAKING: Evolving/moderate complexity  EVALUATION COMPLEXITY: Moderate   GOALS: Goals reviewed with patient? No  SHORT TERM GOALS: Target date: 04/12/2023   Pt will be compliant and knowledgeable with initial HEP for improved comfort and  carryover Baseline: initial HEP given  Goal status: INITIAL  LONG TERM GOALS: Target date: 05/17/2023   Pt will improve FOTO function score to no less than 54% as proxy for functional improvement Baseline: 43% function Goal status: INITIAL   2.  Pt will increase 30 Second Sit to Stand rep count to no less than 10 reps for improved balance, strength, and functional mobility Baseline: 5 reps with UE Goal status: INITIAL   3.  Pt will improve standing activity tolerance when performing home ADLs or community activities to no less than 30 minutes for improved functional ability Baseline: <10 minutes Goal status: INITIAL  4.  Pt will improve LE MMT to no less than 4/5 for all tested motions for improved functional mobility and decrease fall risk Baseline: see MMT chart Goal status: INITIAL   PLAN:  PT FREQUENCY: 1-2x/week  PT DURATION: 8 weeks  PLANNED INTERVENTIONS: 97164- PT Re-evaluation, 97110-Therapeutic exercises, 97530- Therapeutic activity, [id number]- Neuromuscular re-education, 97535- Self Care, [id number]-  Manual therapy, [id number]- Gait training, 21308- Canalith repositioning, 65784- Vasopneumatic device, Cryotherapy, and Moist heat  PLAN FOR NEXT SESSION: assess BP - HEP response, LE strengthening    Eloy End, PT 03/22/2023, 1:01 PM

## 2023-03-28 ENCOUNTER — Encounter: Payer: Self-pay | Admitting: Physical Therapy

## 2023-03-29 ENCOUNTER — Ambulatory Visit: Payer: Medicaid Other | Attending: Family

## 2023-03-29 DIAGNOSIS — R2681 Unsteadiness on feet: Secondary | ICD-10-CM | POA: Insufficient documentation

## 2023-03-29 DIAGNOSIS — R2689 Other abnormalities of gait and mobility: Secondary | ICD-10-CM | POA: Insufficient documentation

## 2023-03-29 DIAGNOSIS — M6281 Muscle weakness (generalized): Secondary | ICD-10-CM | POA: Insufficient documentation

## 2023-03-29 NOTE — Therapy (Incomplete)
OUTPATIENT PHYSICAL THERAPY TREATMENT   Patient Name: Michele George MRN: [id number] DOB:1960-05-27, 62 y.o., female Today's Date: 03/29/2023  END OF SESSION:    Past Medical History:  Diagnosis Date   Hypertension    Past Surgical History:  Procedure Laterality Date   COLONOSCOPY     TOTAL ABDOMINAL HYSTERECTOMY  2012   Patient Active Problem List   Diagnosis Date Noted   Hyperlipidemia LDL goal <100 09/30/2021   Need for Tdap vaccination 09/30/2021   Weakness of both lower extremities 09/30/2021   Essential hypertension 07/24/2021    PCP: Ngetich, Donalee Citrin, NP  REFERRING PROVIDER: Ngetich, Donalee Citrin, NP  REFERRING DIAG:  I10 (ICD-10-CM) - Uncontrolled hypertension R53.1 (ICD-10-CM) - Generalized weakness R29.6 (ICD-10-CM) - Falling episodes  THERAPY DIAG:  No diagnosis found.  Rationale for Evaluation and Treatment: Rehabilitation  ONSET DATE: Chronic  SUBJECTIVE:   SUBJECTIVE STATEMENT: ***  PERTINENT HISTORY: HTN, mild CVA per patient  PAIN:  Are you having pain?  No: NPRS scale: 0/10  PRECAUTIONS: None  RED FLAGS: None   WEIGHT BEARING RESTRICTIONS: No  FALLS:  Has patient fallen in last 6 months? No - has had two close calls   LIVING ENVIRONMENT: Lives with: lives alone Lives in: House/apartment Stairs: Yes: Internal: 12 steps; on right going up Has following equipment at home: Single point cane  OCCUPATION: Not working  PLOF: Independent  PATIENT GOALS: Pt would like to get her functional activity tolerance and strength back   NEXT MD VISIT: 09/09/2022  OBJECTIVE:  Note: Objective measures were completed at Evaluation unless otherwise noted.  DIAGNOSTIC FINDINGS: N/A  VITALS: BP: 1st - 139/108 (automatic) 2nd - 138/110 (automatic) 3rd: 140/110 (manual)    PATIENT SURVEYS:  FOTO: 43% function; 54% predicted  COGNITION: Overall cognitive status: Within functional limits for tasks  assessed     SENSATION: WFL  POSTURE: rounded shoulders and forward head  PALPATION: No TTP noted  LOWER EXTREMITY MMT:  MMT Right eval Left eval  Hip flexion 3/5 3/5  Hip extension    Hip abduction 3/5 3/5  Hip adduction 3/5 3/5  Hip internal rotation    Hip external rotation    Knee flexion 4/5 4/5  Knee extension 4/5 4/5  Ankle dorsiflexion    Ankle plantarflexion    Ankle inversion    Ankle eversion     (Blank rows = not tested)  LOWER EXTREMITY SPECIAL TESTS:  DNT  FUNCTIONAL TESTS:  30 Second Sit to Stand: 5 reps - with UE  GAIT: Distance walked: 8ft Assistive device utilized: Single point cane Level of assistance: Modified independence Comments: trunk flexed, decreased gait speed   TREATMENT: [facility] Adult PT Treatment:                                                DATE: 03/29/2023 Therapeutic Exercise: Supine SLR x 5 each Bridge x 5 Supine clamshell x 5 GTB  [facility] Adult PT Treatment:                                                DATE: 03/22/2023 Therapeutic Exercise: Supine SLR x 5 each Bridge x 5 Supine clamshell x 5 GTB  PATIENT EDUCATION:  Education  details: eval findings, FOTO, HEP, POC Person educated: Patient Education method: Explanation, Demonstration, and Handouts Education comprehension: verbalized understanding and returned demonstration  HOME EXERCISE PROGRAM: Access Code: [email or web address] URL: [email or web address] Date: 03/22/2023 Prepared by: Edwinna Areola  Exercises - Active Straight Leg Raise with Quad Set  - 1 x daily - 7 x weekly - 2 sets - 10 reps - Supine Bridge  - 1 x daily - 7 x weekly - 2 sets - 10 reps - Hooklying Clamshell with Resistance  - 1 x daily - 7 x weekly - 3 sets - 10 reps - green band hold vftjh   ASSESSMENT:  CLINICAL IMPRESSION: ***  EVAL: Patient is a 62 y.o. F who was seen today for physical therapy evaluation and treatment for chronic LE weakness and imbalance. Physical findings are  consistent with referring provider as pt demonstrates decrease in LE MMT and functional mobility. FOTO score demonstrates decrease in subjective functional ability below PLOF. Of note, pt had elevated BP today as she had not yet taken her medication. PT advised pt to take medication at consistent times each day, will reach out to PCP to discuss. Pt would benefit from skilled PT services working on improving strength, balance, and mobility in order to increase safety.    OBJECTIVE IMPAIRMENTS: decreased activity tolerance, decreased balance, decreased mobility, difficulty walking, and decreased strength  ACTIVITY LIMITATIONS: carrying, lifting, standing, squatting, stairs, transfers, and locomotion level  PARTICIPATION LIMITATIONS: meal prep, cleaning, interpersonal relationship, driving, shopping, and community activity  PERSONAL FACTORS: Time since onset of injury/illness/exacerbation and 1-2 comorbidities: HTN, mild CVA per patient  are also affecting patient's functional outcome.   REHAB POTENTIAL: Good  CLINICAL DECISION MAKING: Evolving/moderate complexity  EVALUATION COMPLEXITY: Moderate   GOALS: Goals reviewed with patient? No  SHORT TERM GOALS: Target date: 04/12/2023   Pt will be compliant and knowledgeable with initial HEP for improved comfort and carryover Baseline: initial HEP given  Goal status: INITIAL  LONG TERM GOALS: Target date: 05/17/2023   Pt will improve FOTO function score to no less than 54% as proxy for functional improvement Baseline: 43% function Goal status: INITIAL   2.  Pt will increase 30 Second Sit to Stand rep count to no less than 10 reps for improved balance, strength, and functional mobility Baseline: 5 reps with UE Goal status: INITIAL   3.  Pt will improve standing activity tolerance when performing home ADLs or community activities to no less than 30 minutes for improved functional ability Baseline: <10 minutes Goal status: INITIAL  4.  Pt  will improve LE MMT to no less than 4/5 for all tested motions for improved functional mobility and decrease fall risk Baseline: see MMT chart Goal status: INITIAL   PLAN:  PT FREQUENCY: 1-2x/week  PT DURATION: 8 weeks  PLANNED INTERVENTIONS: 97164- PT Re-evaluation, 97110-Therapeutic exercises, 97530- Therapeutic activity, 97112- Neuromuscular re-education, 97535- Self Care, 54098- Manual therapy, [id number]- Gait training, [phone]- Canalith repositioning, 97016- Vasopneumatic device, Cryotherapy, and Moist heat  PLAN FOR NEXT SESSION: assess BP - HEP response, LE strengthening    Eloy End, PT 03/29/2023, 7:52 AM

## 2023-03-31 ENCOUNTER — Other Ambulatory Visit: Payer: Self-pay | Admitting: Family

## 2023-03-31 ENCOUNTER — Ambulatory Visit
Admission: RE | Admit: 2023-03-31 | Discharge: 2023-03-31 | Disposition: A | Discharge status: Home / Self Care | Payer: Medicaid Other | Source: Ambulatory Visit | Attending: Family | Admitting: Family

## 2023-03-31 DIAGNOSIS — R590 Localized enlarged lymph nodes: Secondary | ICD-10-CM | POA: Diagnosis not present

## 2023-03-31 DIAGNOSIS — N6310 Unspecified lump in the right breast, unspecified quadrant: Secondary | ICD-10-CM

## 2023-03-31 DIAGNOSIS — R928 Other abnormal and inconclusive findings on diagnostic imaging of breast: Secondary | ICD-10-CM

## 2023-03-31 DIAGNOSIS — N6315 Unspecified lump in the right breast, overlapping quadrants: Secondary | ICD-10-CM | POA: Diagnosis not present

## 2023-04-01 ENCOUNTER — Ambulatory Visit
Admission: RE | Admit: 2023-04-01 | Discharge: 2023-04-01 | Disposition: A | Discharge status: Home / Self Care | Payer: Medicaid Other | Source: Ambulatory Visit | Attending: Family | Admitting: Family

## 2023-04-01 DIAGNOSIS — N6315 Unspecified lump in the right breast, overlapping quadrants: Secondary | ICD-10-CM | POA: Diagnosis not present

## 2023-04-01 DIAGNOSIS — N6310 Unspecified lump in the right breast, unspecified quadrant: Secondary | ICD-10-CM

## 2023-04-01 DIAGNOSIS — N6011 Diffuse cystic mastopathy of right breast: Secondary | ICD-10-CM | POA: Diagnosis not present

## 2023-04-01 HISTORY — PX: BREAST BIOPSY: SHX20

## 2023-04-04 LAB — SURGICAL PATHOLOGY

## 2023-04-08 ENCOUNTER — Ambulatory Visit: Payer: Medicaid Other

## 2023-04-08 DIAGNOSIS — R2689 Other abnormalities of gait and mobility: Secondary | ICD-10-CM

## 2023-04-08 DIAGNOSIS — R2681 Unsteadiness on feet: Secondary | ICD-10-CM

## 2023-04-08 DIAGNOSIS — M6281 Muscle weakness (generalized): Secondary | ICD-10-CM | POA: Diagnosis not present

## 2023-04-08 NOTE — Therapy (Signed)
OUTPATIENT PHYSICAL THERAPY TREATMENT   Patient Name: Michele George MRN: [id number] DOB:[birth year], 62 y.o., female Today's Date: 04/08/2023  END OF SESSION:  PT End of Session - 04/08/23 0918     Visit Number 2    Number of Visits 15    Date for PT Re-Evaluation 05/17/23    Authorization Type [place] [employer]    PT Start Time 0925    PT Stop Time 1005    PT Time Calculation (min) 40 min    Activity Tolerance Patient tolerated treatment well    Behavior During Therapy [facility] for tasks assessed/performed              Past Medical History:  Diagnosis Date   Hypertension    Past Surgical History:  Procedure Laterality Date   BREAST BIOPSY Right 04/01/2023   [place] RT BREAST BX W LOC DEV 1ST LESION IMG BX SPEC US GUIDE 04/01/2023 [facility]   COLONOSCOPY     TOTAL ABDOMINAL HYSTERECTOMY  2012   Patient Active Problem List   Diagnosis Date Noted   Hyperlipidemia LDL goal <100 09/30/2021   Need for Tdap vaccination 09/30/2021   Weakness of both lower extremities 09/30/2021   Essential hypertension 07/24/2021    PCP: Ngetich, Donalee Citrin, NP  REFERRING PROVIDER: Ngetich, Donalee Citrin, NP  REFERRING DIAG:  I10 (ICD-10-CM) - Uncontrolled hypertension R53.1 (ICD-10-CM) - Generalized weakness R29.6 (ICD-10-CM) - Falling episodes  THERAPY DIAG:  Muscle weakness (generalized)  Other abnormalities of gait and mobility  Unsteadiness on feet  Rationale for Evaluation and Treatment: Rehabilitation  ONSET DATE: Chronic  SUBJECTIVE:   SUBJECTIVE STATEMENT: Pt presents to PT with no current pain. Has been doing better taking BP medication at consistent times every day.   PERTINENT HISTORY: HTN, mild CVA per patient  PAIN:  Are you having pain?  No: NPRS scale: 0/10  PRECAUTIONS: None  RED FLAGS: None   WEIGHT BEARING RESTRICTIONS: No  FALLS:  Has patient fallen in last 6 months? No - has had two close calls   LIVING ENVIRONMENT: Lives with: lives  alone Lives in: House/apartment Stairs: Yes: Internal: 12 steps; on right going up Has following equipment at home: Single point cane  OCCUPATION: Not working  PLOF: Independent  PATIENT GOALS: Pt would like to get her functional activity tolerance and strength back   NEXT MD VISIT: 09/09/2022  OBJECTIVE:  Note: Objective measures were completed at Evaluation unless otherwise noted.  DIAGNOSTIC FINDINGS: N/A  VITALS: BP: 132/92 - L arm manual cuff    PATIENT SURVEYS:  FOTO: 43% function; 54% predicted  COGNITION: Overall cognitive status: Within functional limits for tasks assessed     SENSATION: WFL  POSTURE: rounded shoulders and forward head  PALPATION: No TTP noted  LOWER EXTREMITY MMT:  MMT Right eval Left eval  Hip flexion 3/5 3/5  Hip extension    Hip abduction 3/5 3/5  Hip adduction 3/5 3/5  Hip internal rotation    Hip external rotation    Knee flexion 4/5 4/5  Knee extension 4/5 4/5  Ankle dorsiflexion    Ankle plantarflexion    Ankle inversion    Ankle eversion     (Blank rows = not tested)  LOWER EXTREMITY SPECIAL TESTS:  DNT  FUNCTIONAL TESTS:  30 Second Sit to Stand: 5 reps - with UE  GAIT: Distance walked: 22ft Assistive device utilized: Single point cane Level of assistance: Modified independence Comments: trunk flexed, decreased gait speed   TREATMENT: [facility]  Adult PT Treatment:                                                DATE: 04/08/2023 Therapeutic Exercise: NuStep lvl 5 UE/LE x 4 min while taking subjective Supine SLR 2x10 2# Bridge with blue band 2x10 Supine clamshell 2x15 blue band Lateral walk RTB x 3 laps  Neuromuscular Re-Ed: Tandem stance 2x30" each Tandem walk in // x 2 laps FT on foam 2x30" FT on foam with head turns x 30"  [facility] Adult PT Treatment:                                                DATE: 03/22/2023 Therapeutic Exercise: Supine SLR x 5 each Bridge x 5 Supine clamshell x 5 GTB  PATIENT  EDUCATION:  Education details: eval findings, FOTO, HEP, POC Person educated: Patient Education method: Explanation, Demonstration, and Handouts Education comprehension: verbalized understanding and returned demonstration  HOME EXERCISE PROGRAM: Access Code: [email or web address] URL: [email or web address] Date: 04/08/2023 Prepared by: Edwinna Areola  Exercises - Active Straight Leg Raise with Quad Set  - 1 x daily - 7 x weekly - 2 sets - 10 reps - Supine Bridge  - 1 x daily - 7 x weekly - 2 sets - 10 reps - Hooklying Clamshell with Resistance  - 1 x daily - 7 x weekly - 3 sets - 10 reps - green band hold - Standing Tandem Balance with Counter Support  - 1 x daily - 7 x weekly - 2 reps - 30 sec hold   ASSESSMENT:  CLINICAL IMPRESSION: Pt was able to complete all prescribed strengthening and balance exercises with no adverse effect. Therapy focused on generalized LE strengthening with emphasis on proximal hip. Updated HEP for static balance at kitchen counter with tandem stance. Continues to show global LE weakness. Will continue to take BP before session and progress as tolerated.   EVAL: Patient is a 62 y.o. F who was seen today for physical therapy evaluation and treatment for chronic LE weakness and imbalance. Physical findings are consistent with referring provider as pt demonstrates decrease in LE MMT and functional mobility. FOTO score demonstrates decrease in subjective functional ability below PLOF. Of note, pt had elevated BP today as she had not yet taken her medication. PT advised pt to take medication at consistent times each day, will reach out to PCP to discuss. Pt would benefit from skilled PT services working on improving strength, balance, and mobility in order to increase safety.    OBJECTIVE IMPAIRMENTS: decreased activity tolerance, decreased balance, decreased mobility, difficulty walking, and decreased strength  ACTIVITY LIMITATIONS: carrying, lifting, standing,  squatting, stairs, transfers, and locomotion level  PARTICIPATION LIMITATIONS: meal prep, cleaning, interpersonal relationship, driving, shopping, and community activity  PERSONAL FACTORS: Time since onset of injury/illness/exacerbation and 1-2 comorbidities: HTN, mild CVA per patient  are also affecting patient's functional outcome.   REHAB POTENTIAL: Good  CLINICAL DECISION MAKING: Evolving/moderate complexity  EVALUATION COMPLEXITY: Moderate   GOALS: Goals reviewed with patient? No  SHORT TERM GOALS: Target date: 04/12/2023   Pt will be compliant and knowledgeable with initial HEP for improved comfort and carryover Baseline: initial HEP given  Goal status: INITIAL  LONG TERM GOALS:  Target date: 05/17/2023   Pt will improve FOTO function score to no less than 54% as proxy for functional improvement Baseline: 43% function Goal status: INITIAL   2.  Pt will increase 30 Second Sit to Stand rep count to no less than 10 reps for improved balance, strength, and functional mobility Baseline: 5 reps with UE Goal status: INITIAL   3.  Pt will improve standing activity tolerance when performing home ADLs or community activities to no less than 30 minutes for improved functional ability Baseline: <10 minutes Goal status: INITIAL  4.  Pt will improve LE MMT to no less than 4/5 for all tested motions for improved functional mobility and decrease fall risk Baseline: see MMT chart Goal status: INITIAL   PLAN:  PT FREQUENCY: 1-2x/week  PT DURATION: 8 weeks  PLANNED INTERVENTIONS: 97164- PT Re-evaluation, 97110-Therapeutic exercises, 97530- Therapeutic activity, 97112- Neuromuscular re-education, 97535- Self Care, 13086- Manual therapy, [id number]- Gait training, [phone]- Canalith repositioning, 97016- Vasopneumatic device, Cryotherapy, and Moist heat  PLAN FOR NEXT SESSION: assess BP - HEP response, LE strengthening    Eloy End, PT 04/08/2023, 10:11 AM

## 2023-04-13 ENCOUNTER — Ambulatory Visit: Payer: Medicaid Other

## 2023-04-13 DIAGNOSIS — R2689 Other abnormalities of gait and mobility: Secondary | ICD-10-CM

## 2023-04-13 DIAGNOSIS — R2681 Unsteadiness on feet: Secondary | ICD-10-CM

## 2023-04-13 DIAGNOSIS — M6281 Muscle weakness (generalized): Secondary | ICD-10-CM

## 2023-04-13 NOTE — Therapy (Signed)
OUTPATIENT PHYSICAL THERAPY TREATMENT   Patient Name: Michele George MRN: [id number] DOB:Aug 21, 1960, 62 y.o., female Today's Date: 04/13/2023  END OF SESSION:  PT End of Session - 04/13/23 1128     Visit Number 3    Number of Visits 15    Date for PT Re-Evaluation 05/17/23    Authorization Type [place] [employer]    PT Start Time 1132    PT Stop Time 1210    PT Time Calculation (min) 38 min    Activity Tolerance Patient tolerated treatment well    Behavior During Therapy WFL for tasks assessed/performed               Past Medical History:  Diagnosis Date   Hypertension    Past Surgical History:  Procedure Laterality Date   BREAST BIOPSY Right 04/01/2023   [place] RT BREAST BX W LOC DEV 1ST LESION IMG BX SPEC US GUIDE 04/01/2023 [facility]   COLONOSCOPY     TOTAL ABDOMINAL HYSTERECTOMY  2012   Patient Active Problem List   Diagnosis Date Noted   Hyperlipidemia LDL goal <100 09/30/2021   Need for Tdap vaccination 09/30/2021   Weakness of both lower extremities 09/30/2021   Essential hypertension 07/24/2021    PCP: Ngetich, Donalee Citrin, NP  REFERRING PROVIDER: Ngetich, Donalee Citrin, NP  REFERRING DIAG:  I10 (ICD-10-CM) - Uncontrolled hypertension R53.1 (ICD-10-CM) - Generalized weakness R29.6 (ICD-10-CM) - Falling episodes  THERAPY DIAG:  Muscle weakness (generalized)  Other abnormalities of gait and mobility  Unsteadiness on feet  Rationale for Evaluation and Treatment: Rehabilitation  ONSET DATE: Chronic  SUBJECTIVE:   SUBJECTIVE STATEMENT: Pt presents to PT with reports of no current change in status.   PERTINENT HISTORY: HTN, mild CVA per patient  PAIN:  Are you having pain?  No: NPRS scale: 0/10  PRECAUTIONS: None  RED FLAGS: None   WEIGHT BEARING RESTRICTIONS: No  FALLS:  Has patient fallen in last 6 months? No - has had two close calls   LIVING ENVIRONMENT: Lives with: lives alone Lives in: House/apartment Stairs: Yes:  Internal: 12 steps; on right going up Has following equipment at home: Single point cane  OCCUPATION: Not working  PLOF: Independent  PATIENT GOALS: Pt would like to get her functional activity tolerance and strength back   NEXT MD VISIT: 09/09/2022  OBJECTIVE:  Note: Objective measures were completed at Evaluation unless otherwise noted.  DIAGNOSTIC FINDINGS: N/A  VITALS: BP: 128/82- L arm manual cuff    PATIENT SURVEYS:  FOTO: 43% function; 54% predicted  COGNITION: Overall cognitive status: Within functional limits for tasks assessed     SENSATION: WFL  POSTURE: rounded shoulders and forward head  PALPATION: No TTP noted  LOWER EXTREMITY MMT:  MMT Right eval Left eval  Hip flexion 3/5 3/5  Hip extension    Hip abduction 3/5 3/5  Hip adduction 3/5 3/5  Hip internal rotation    Hip external rotation    Knee flexion 4/5 4/5  Knee extension 4/5 4/5  Ankle dorsiflexion    Ankle plantarflexion    Ankle inversion    Ankle eversion     (Blank rows = not tested)  LOWER EXTREMITY SPECIAL TESTS:  DNT  FUNCTIONAL TESTS:  30 Second Sit to Stand: 5 reps - with UE  GAIT: Distance walked: 30ft Assistive device utilized: Single point cane Level of assistance: Modified independence Comments: trunk flexed, decreased gait speed   TREATMENT: [facility] Adult PT Treatment:  DATE: 04/13/2023 Therapeutic Exercise: Rec bike lvl 2 x 3 min while taking subjective Lateral walk RTB x 2 laps Standing hip abduction/ext x 10 RTB LAQ 2x10 3# Supine SLR 2x15 each Bridge with ball 2x10 Neuromuscular Re-Ed: FT on foam x 30" FT on foam with head turns 2x30" Tandem stance on foam x 30" each Hurdle step over x 2 laps - 1UE support  [facility] Adult PT Treatment:                                                DATE: 04/08/2023 Therapeutic Exercise: NuStep lvl 5 UE/LE x 4 min while taking subjective Supine SLR 2x10 2# Bridge with blue  band 2x10 Supine clamshell 2x15 blue band Lateral walk RTB x 3 laps  Neuromuscular Re-Ed: Tandem stance 2x30" each Tandem walk in // x 2 laps FT on foam 2x30" FT on foam with head turns x 30"  [facility] Adult PT Treatment:                                                DATE: 03/22/2023 Therapeutic Exercise: Supine SLR x 5 each Bridge x 5 Supine clamshell x 5 GTB  PATIENT EDUCATION:  Education details: eval findings, FOTO, HEP, POC Person educated: Patient Education method: Explanation, Demonstration, and Handouts Education comprehension: verbalized understanding and returned demonstration  HOME EXERCISE PROGRAM: Access Code: [email or web address] URL: [email or web address] Date: 04/08/2023 Prepared by: Edwinna Areola  Exercises - Active Straight Leg Raise with Quad Set  - 1 x daily - 7 x weekly - 2 sets - 10 reps - Supine Bridge  - 1 x daily - 7 x weekly - 2 sets - 10 reps - Hooklying Clamshell with Resistance  - 1 x daily - 7 x weekly - 3 sets - 10 reps - green band hold - Standing Tandem Balance with Counter Support  - 1 x daily - 7 x weekly - 2 reps - 30 sec hold   ASSESSMENT:  CLINICAL IMPRESSION: Pt was able to complete all prescribed strengthening and balance exercises with no adverse effect. Therapy focused on general LE strengthening with emphasis on proximal hip. Continues to show global LE weakness and unsteadiness with stepping over obstacles and dynamic movements. Will continue to take BP before session and progress as tolerated.   EVAL: Patient is a 62 y.o. F who was seen today for physical therapy evaluation and treatment for chronic LE weakness and imbalance. Physical findings are consistent with referring provider as pt demonstrates decrease in LE MMT and functional mobility. FOTO score demonstrates decrease in subjective functional ability below PLOF. Of note, pt had elevated BP today as she had not yet taken her medication. PT advised pt to take medication at  consistent times each day, will reach out to PCP to discuss. Pt would benefit from skilled PT services working on improving strength, balance, and mobility in order to increase safety.    OBJECTIVE IMPAIRMENTS: decreased activity tolerance, decreased balance, decreased mobility, difficulty walking, and decreased strength  ACTIVITY LIMITATIONS: carrying, lifting, standing, squatting, stairs, transfers, and locomotion level  PARTICIPATION LIMITATIONS: meal prep, cleaning, interpersonal relationship, driving, shopping, and community activity  PERSONAL FACTORS: Time since onset of injury/illness/exacerbation and 1-2 comorbidities: HTN, mild CVA  per patient  are also affecting patient's functional outcome.   REHAB POTENTIAL: Good  CLINICAL DECISION MAKING: Evolving/moderate complexity  EVALUATION COMPLEXITY: Moderate   GOALS: Goals reviewed with patient? No  SHORT TERM GOALS: Target date: 04/12/2023   Pt will be compliant and knowledgeable with initial HEP for improved comfort and carryover Baseline: initial HEP given  Goal status: INITIAL  LONG TERM GOALS: Target date: 05/17/2023   Pt will improve FOTO function score to no less than 54% as proxy for functional improvement Baseline: 43% function Goal status: INITIAL   2.  Pt will increase 30 Second Sit to Stand rep count to no less than 10 reps for improved balance, strength, and functional mobility Baseline: 5 reps with UE Goal status: INITIAL   3.  Pt will improve standing activity tolerance when performing home ADLs or community activities to no less than 30 minutes for improved functional ability Baseline: <10 minutes Goal status: INITIAL  4.  Pt will improve LE MMT to no less than 4/5 for all tested motions for improved functional mobility and decrease fall risk Baseline: see MMT chart Goal status: INITIAL   PLAN:  PT FREQUENCY: 1-2x/week  PT DURATION: 8 weeks  PLANNED INTERVENTIONS: 97164- PT Re-evaluation,  97110-Therapeutic exercises, 97530- Therapeutic activity, 97112- Neuromuscular re-education, 97535- Self Care, [id number]- Manual therapy, [id number]- Gait training, [phone]- Canalith repositioning, 97016- Vasopneumatic device, Cryotherapy, and Moist heat  PLAN FOR NEXT SESSION: assess BP - HEP response, LE strengthening    Eloy End, PT 04/13/2023, 1:08 PM

## 2023-04-15 ENCOUNTER — Ambulatory Visit: Payer: Medicaid Other | Admitting: Physical Therapy

## 2023-04-15 ENCOUNTER — Encounter: Payer: Self-pay | Admitting: Physical Therapy

## 2023-04-15 DIAGNOSIS — M6281 Muscle weakness (generalized): Secondary | ICD-10-CM

## 2023-04-15 DIAGNOSIS — R2681 Unsteadiness on feet: Secondary | ICD-10-CM

## 2023-04-15 DIAGNOSIS — R2689 Other abnormalities of gait and mobility: Secondary | ICD-10-CM

## 2023-04-15 NOTE — Therapy (Signed)
OUTPATIENT PHYSICAL THERAPY TREATMENT   Patient Name: Michele George MRN: [id number] DOB:[birth year], 62 y.o., female Today's Date: 04/15/2023  END OF SESSION:  PT End of Session - 04/15/23 1147     Visit Number 4    Number of Visits 15    Date for PT Re-Evaluation 05/17/23    Authorization Type [place] [employer]    PT Start Time 1145    PT Stop Time 1228    PT Time Calculation (min) 43 min               Past Medical History:  Diagnosis Date   Hypertension    Past Surgical History:  Procedure Laterality Date   BREAST BIOPSY Right 04/01/2023   [place] RT BREAST BX W LOC DEV 1ST LESION IMG BX SPEC US GUIDE 04/01/2023 [facility]   COLONOSCOPY     TOTAL ABDOMINAL HYSTERECTOMY  2012   Patient Active Problem List   Diagnosis Date Noted   Hyperlipidemia LDL goal <100 09/30/2021   Need for Tdap vaccination 09/30/2021   Weakness of both lower extremities 09/30/2021   Essential hypertension 07/24/2021    PCP: Ngetich, Donalee Citrin, NP  REFERRING PROVIDER: Ngetich, Donalee Citrin, NP  REFERRING DIAG:  I10 (ICD-10-CM) - Uncontrolled hypertension R53.1 (ICD-10-CM) - Generalized weakness R29.6 (ICD-10-CM) - Falling episodes  THERAPY DIAG:  Muscle weakness (generalized)  Other abnormalities of gait and mobility  Unsteadiness on feet  Rationale for Evaluation and Treatment: Rehabilitation  ONSET DATE: Chronic  SUBJECTIVE:   SUBJECTIVE STATEMENT: Pt presents to PT with reports of no current change in status.   PERTINENT HISTORY: HTN, mild CVA per patient  PAIN:  Are you having pain?  No: NPRS scale: 0/10  PRECAUTIONS: None  RED FLAGS: None   WEIGHT BEARING RESTRICTIONS: No  FALLS:  Has patient fallen in last 6 months? No - has had two close calls   LIVING ENVIRONMENT: Lives with: lives alone Lives in: House/apartment Stairs: Yes: Internal: 12 steps; on right going up Has following equipment at home: Single point cane  OCCUPATION: Not  working  PLOF: Independent  PATIENT GOALS: Pt would like to get her functional activity tolerance and strength back   NEXT MD VISIT: 09/09/2022  OBJECTIVE:  Note: Objective measures were completed at Evaluation unless otherwise noted.  DIAGNOSTIC FINDINGS: N/A  VITALS: BP: 128/82- L arm manual cuff    PATIENT SURVEYS:  FOTO: 43% function; 54% predicted  COGNITION: Overall cognitive status: Within functional limits for tasks assessed     SENSATION: WFL  POSTURE: rounded shoulders and forward head  PALPATION: No TTP noted  LOWER EXTREMITY MMT:  MMT Right eval Left eval Right 04/15/23 Left 04/15/23  Hip flexion 3/5 3/5 4 4   Hip extension      Hip abduction 3/5 3/5    Hip adduction 3/5 3/5    Hip internal rotation      Hip external rotation      Knee flexion 4/5 4/5    Knee extension 4/5 4/5    Ankle dorsiflexion      Ankle plantarflexion      Ankle inversion      Ankle eversion       (Blank rows = not tested)  LOWER EXTREMITY SPECIAL TESTS:  DNT  FUNCTIONAL TESTS:  30 Second Sit to Stand: 5 reps - with UE: 04/15/23: 7 reps    GAIT: Distance walked: 24ft Assistive device utilized: Single point cane Level of assistance: Modified independence Comments: trunk flexed, decreased  gait speed   TREATMENT: [facility] Adult PT Treatment:                                                DATE: 04/15/23 BP 129/89 Therapeutic Exercise: Nustep L 5 5 minutes UE/LE  Heel raises 10 x 2  A/P blue rocker board x 2 minutes progressing from bilat UE to no UE Lateral blue rocking x 2 minutes progressing from bilat UE to no UE STS 10 x 2 - with cues for controlled descent  Bridge x 15 SLR x 15 each  Side hip abduction x 15 each     [facility] Adult PT Treatment:                                                DATE: 04/13/2023 Therapeutic Exercise: Rec bike lvl 2 x 3 min while taking subjective Lateral walk RTB x 2 laps Standing hip abduction/ext x 10 RTB LAQ 2x10 3# Supine  SLR 2x15 each Bridge with ball 2x10 Neuromuscular Re-Ed: FT on foam x 30" FT on foam with head turns 2x30" Tandem stance on foam x 30" each Hurdle step over x 2 laps - 1UE support  [facility] Adult PT Treatment:                                                DATE: 04/08/2023 Therapeutic Exercise: NuStep lvl 5 UE/LE x 4 min while taking subjective Supine SLR 2x10 2# Bridge with blue band 2x10 Supine clamshell 2x15 blue band Lateral walk RTB x 3 laps  Neuromuscular Re-Ed: Tandem stance 2x30" each Tandem walk in // x 2 laps FT on foam 2x30" FT on foam with head turns x 30"  [facility] Adult PT Treatment:                                                DATE: 03/22/2023 Therapeutic Exercise: Supine SLR x 5 each Bridge x 5 Supine clamshell x 5 GTB  PATIENT EDUCATION:  Education details: eval findings, FOTO, HEP, POC Person educated: Patient Education method: Explanation, Demonstration, and Handouts Education comprehension: verbalized understanding and returned demonstration  HOME EXERCISE PROGRAM: Access Code: [email or web address] URL: [email or web address] Date: 04/08/2023 Prepared by: Edwinna Areola  Exercises - Active Straight Leg Raise with Quad Set  - 1 x daily - 7 x weekly - 2 sets - 10 reps - Supine Bridge  - 1 x daily - 7 x weekly - 2 sets - 10 reps - Hooklying Clamshell with Resistance  - 1 x daily - 7 x weekly - 3 sets - 10 reps - green band hold - Standing Tandem Balance with Counter Support  - 1 x daily - 7 x weekly - 2 reps - 30 sec hold   ASSESSMENT:  CLINICAL IMPRESSION: Pt was able to complete all prescribed strengthening and balance exercises with no adverse effect. Pt reports minimal compliance with HEP and no overall change in her condition. Continued with dynamic  balance which pt tolerated well using reaching strategy as needed. Her 30 sec STS and Hip flexion MMT have improved. Bp 129/89 at start of session. Pt does not take BP at home. At end of session she reported  fatigue.  Will continue to take BP before session and progress as tolerated.   EVAL: Patient is a 62 y.o. F who was seen today for physical therapy evaluation and treatment for chronic LE weakness and imbalance. Physical findings are consistent with referring provider as pt demonstrates decrease in LE MMT and functional mobility. FOTO score demonstrates decrease in subjective functional ability below PLOF. Of note, pt had elevated BP today as she had not yet taken her medication. PT advised pt to take medication at consistent times each day, will reach out to PCP to discuss. Pt would benefit from skilled PT services working on improving strength, balance, and mobility in order to increase safety.    OBJECTIVE IMPAIRMENTS: decreased activity tolerance, decreased balance, decreased mobility, difficulty walking, and decreased strength  ACTIVITY LIMITATIONS: carrying, lifting, standing, squatting, stairs, transfers, and locomotion level  PARTICIPATION LIMITATIONS: meal prep, cleaning, interpersonal relationship, driving, shopping, and community activity  PERSONAL FACTORS: Time since onset of injury/illness/exacerbation and 1-2 comorbidities: HTN, mild CVA per patient  are also affecting patient's functional outcome.   REHAB POTENTIAL: Good  CLINICAL DECISION MAKING: Evolving/moderate complexity  EVALUATION COMPLEXITY: Moderate   GOALS: Goals reviewed with patient? No  SHORT TERM GOALS: Target date: 04/12/2023   Pt will be compliant and knowledgeable with initial HEP for improved comfort and carryover Baseline: initial HEP given  04/15/23: min compliance  Goal status: ONGOING  LONG TERM GOALS: Target date: 05/17/2023   Pt will improve FOTO function score to no less than 54% as proxy for functional improvement Baseline: 43% function Goal status: INITIAL   2.  Pt will increase 30 Second Sit to Stand rep count to no less than 10 reps for improved balance, strength, and functional  mobility Baseline: 5 reps with UE 04/15/23: 7 reps without UE  Goal status: ONOING    3.  Pt will improve standing activity tolerance when performing home ADLs or community activities to no less than 30 minutes for improved functional ability Baseline: <10 minutes Goal status: INITIAL  4.  Pt will improve LE MMT to no less than 4/5 for all tested motions for improved functional mobility and decrease fall risk Baseline: see MMT chart Goal status: INITIAL   PLAN:  PT FREQUENCY: 1-2x/week  PT DURATION: 8 weeks  PLANNED INTERVENTIONS: 97164- PT Re-evaluation, 97110-Therapeutic exercises, 97530- Therapeutic activity, 97112- Neuromuscular re-education, 97535- Self Care, 16109- Manual therapy, [id number]- Gait training, [phone]- Canalith repositioning, 97016- Vasopneumatic device, Cryotherapy, and Moist heat  PLAN FOR NEXT SESSION: assess BP - HEP response, LE strengthening    Sherrie Mustache, PTA 04/15/2023, 12:32 PM

## 2023-04-19 ENCOUNTER — Ambulatory Visit: Payer: Medicaid Other

## 2023-04-19 DIAGNOSIS — M6281 Muscle weakness (generalized): Secondary | ICD-10-CM | POA: Diagnosis not present

## 2023-04-19 DIAGNOSIS — R2681 Unsteadiness on feet: Secondary | ICD-10-CM | POA: Diagnosis not present

## 2023-04-19 DIAGNOSIS — R2689 Other abnormalities of gait and mobility: Secondary | ICD-10-CM | POA: Diagnosis not present

## 2023-04-19 NOTE — Therapy (Signed)
OUTPATIENT PHYSICAL THERAPY TREATMENT   Patient Name: Michele George MRN: [id number] DOB:1961/03/28, 62 y.o., female Today's Date: 04/19/2023  END OF SESSION:  PT End of Session - 04/19/23 1051     Visit Number 5    Number of Visits 15    Date for PT Re-Evaluation 05/17/23    Authorization Type  [employer]    PT Start Time 1054    PT Stop Time 1136    PT Time Calculation (min) 42 min                Past Medical History:  Diagnosis Date   Hypertension    Past Surgical History:  Procedure Laterality Date   BREAST BIOPSY Right 04/01/2023   [place] RT BREAST BX W LOC DEV 1ST LESION IMG BX SPEC US GUIDE 04/01/2023 [facility]   COLONOSCOPY     TOTAL ABDOMINAL HYSTERECTOMY  2012   Patient Active Problem List   Diagnosis Date Noted   Hyperlipidemia LDL goal <100 09/30/2021   Need for Tdap vaccination 09/30/2021   Weakness of both lower extremities 09/30/2021   Essential hypertension 07/24/2021    PCP: Ngetich, Donalee Citrin, NP  REFERRING PROVIDER: Ngetich, Donalee Citrin, NP  REFERRING DIAG:  I10 (ICD-10-CM) - Uncontrolled hypertension R53.1 (ICD-10-CM) - Generalized weakness R29.6 (ICD-10-CM) - Falling episodes  THERAPY DIAG:  Muscle weakness (generalized)  Other abnormalities of gait and mobility  Unsteadiness on feet  Rationale for Evaluation and Treatment: Rehabilitation  ONSET DATE: Chronic  SUBJECTIVE:   SUBJECTIVE STATEMENT: Pt presents to PT with reports of no current change in status. Has been compliant with HEP.    PERTINENT HISTORY: HTN, mild CVA per patient  PAIN:  Are you having pain?  No: NPRS scale: 0/10  PRECAUTIONS: None  RED FLAGS: None   WEIGHT BEARING RESTRICTIONS: No  FALLS:  Has patient fallen in last 6 months? No - has had two close calls   LIVING ENVIRONMENT: Lives with: lives alone Lives in: House/apartment Stairs: Yes: Internal: 12 steps; on right going up Has following equipment at home: Single point  cane  OCCUPATION: Not working  PLOF: Independent  PATIENT GOALS: Pt would like to get her functional activity tolerance and strength back   NEXT MD VISIT: 09/09/2022  OBJECTIVE:  Note: Objective measures were completed at Evaluation unless otherwise noted.  DIAGNOSTIC FINDINGS: N/A  VITALS: BP: 128/82- L arm manual cuff    PATIENT SURVEYS:  FOTO: 43% function; 54% predicted  COGNITION: Overall cognitive status: Within functional limits for tasks assessed     SENSATION: WFL  POSTURE: rounded shoulders and forward head  PALPATION: No TTP noted  LOWER EXTREMITY MMT:  MMT Right eval Left eval Right 04/15/23 Left 04/15/23  Hip flexion 3/5 3/5 4 4   Hip extension      Hip abduction 3/5 3/5    Hip adduction 3/5 3/5    Hip internal rotation      Hip external rotation      Knee flexion 4/5 4/5    Knee extension 4/5 4/5    Ankle dorsiflexion      Ankle plantarflexion      Ankle inversion      Ankle eversion       (Blank rows = not tested)  LOWER EXTREMITY SPECIAL TESTS:  DNT  FUNCTIONAL TESTS:  30 Second Sit to Stand: 5 reps - with UE: 04/15/23: 7 reps    GAIT: Distance walked: 67ft Assistive device utilized: Single point cane Level of  assistance: Modified independence Comments: trunk flexed, decreased gait speed   TREATMENT: [facility] Adult PT Treatment:                                                DATE: 04/19/23 BP 129/89 Therapeutic Exercise: Nustep L5 5 minutes UE/LE while taking subjective STS with eccentric descent 2x10 Bridge with black band 2x10  Supine clamshell 2x15 black band Supine SLR 2x10 2# S/L hip abd 2x10 each Lateral walk RTB x 3 laps in // Standing hip abd/ext x 10 each RTB in // Neuromuscular Re-Ed: Tandem stance on foam 2x30" each FT on foam with head turns 2x30" Blue wobble board DF/PF 2x60"  [facility] Adult PT Treatment:                                                DATE: 04/15/23 BP 129/89 Therapeutic Exercise: Nustep L 5 5  minutes UE/LE  Heel raises 10 x 2  A/P blue rocker board x 2 minutes progressing from bilat UE to no UE Lateral blue rocking x 2 minutes progressing from bilat UE to no UE STS 10 x 2 - with cues for controlled descent  Bridge x 15 SLR x 15 each  Side hip abduction x 15 each     [facility] Adult PT Treatment:                                                DATE: 04/13/2023 Therapeutic Exercise: Rec bike lvl 2 x 3 min while taking subjective Lateral walk RTB x 2 laps Standing hip abduction/ext x 10 RTB LAQ 2x10 3# Supine SLR 2x15 each Bridge with ball 2x10 Neuromuscular Re-Ed: FT on foam x 30" FT on foam with head turns 2x30" Tandem stance on foam x 30" each Hurdle step over x 2 laps - 1UE support  [facility] Adult PT Treatment:                                                DATE: 04/08/2023 Therapeutic Exercise: NuStep lvl 5 UE/LE x 4 min while taking subjective Supine SLR 2x10 2# Bridge with blue band 2x10 Supine clamshell 2x15 blue band Lateral walk RTB x 3 laps  Neuromuscular Re-Ed: Tandem stance 2x30" each Tandem walk in // x 2 laps FT on foam 2x30" FT on foam with head turns x 30"  [facility] Adult PT Treatment:                                                DATE: 03/22/2023 Therapeutic Exercise: Supine SLR x 5 each Bridge x 5 Supine clamshell x 5 GTB  PATIENT EDUCATION:  Education details: eval findings, FOTO, HEP, POC Person educated: Patient Education method: Explanation, Demonstration, and Handouts Education comprehension: verbalized understanding and returned demonstration  HOME EXERCISE PROGRAM: Access Code: [email or web address]  URL: [email or web address] Date: 04/08/2023 Prepared by: Edwinna Areola  Exercises - Active Straight Leg Raise with Quad Set  - 1 x daily - 7 x weekly - 2 sets - 10 reps - Supine Bridge  - 1 x daily - 7 x weekly - 2 sets - 10 reps - Hooklying Clamshell with Resistance  - 1 x daily - 7 x weekly - 3 sets - 10 reps - green band hold - Standing  Tandem Balance with Counter Support  - 1 x daily - 7 x weekly - 2 reps - 30 sec hold   ASSESSMENT:  CLINICAL IMPRESSION: Pt was able to complete all prescribed strengthening and balance exercises with no adverse effect. Therapy continued to focus on improving LE strength and balance. Notes she feels like she is getting stronger but has not noticed much change. Will assess FOTO session. Will continue to take BP before session and progress as tolerated.   EVAL: Patient is a 62 y.o. F who was seen today for physical therapy evaluation and treatment for chronic LE weakness and imbalance. Physical findings are consistent with referring provider as pt demonstrates decrease in LE MMT and functional mobility. FOTO score demonstrates decrease in subjective functional ability below PLOF. Of note, pt had elevated BP today as she had not yet taken her medication. PT advised pt to take medication at consistent times each day, will reach out to PCP to discuss. Pt would benefit from skilled PT services working on improving strength, balance, and mobility in order to increase safety.    OBJECTIVE IMPAIRMENTS: decreased activity tolerance, decreased balance, decreased mobility, difficulty walking, and decreased strength  ACTIVITY LIMITATIONS: carrying, lifting, standing, squatting, stairs, transfers, and locomotion level  PARTICIPATION LIMITATIONS: meal prep, cleaning, interpersonal relationship, driving, shopping, and community activity  PERSONAL FACTORS: Time since onset of injury/illness/exacerbation and 1-2 comorbidities: HTN, mild CVA per patient  are also affecting patient's functional outcome.   REHAB POTENTIAL: Good  CLINICAL DECISION MAKING: Evolving/moderate complexity  EVALUATION COMPLEXITY: Moderate   GOALS: Goals reviewed with patient? No  SHORT TERM GOALS: Target date: 04/12/2023   Pt will be compliant and knowledgeable with initial HEP for improved comfort and carryover Baseline: initial  HEP given  04/15/23: min compliance  Goal status: ONGOING  LONG TERM GOALS: Target date: 05/17/2023   Pt will improve FOTO function score to no less than 54% as proxy for functional improvement Baseline: 43% function Goal status: INITIAL   2.  Pt will increase 30 Second Sit to Stand rep count to no less than 10 reps for improved balance, strength, and functional mobility Baseline: 5 reps with UE 04/15/23: 7 reps without UE  Goal status: ONOING    3.  Pt will improve standing activity tolerance when performing home ADLs or community activities to no less than 30 minutes for improved functional ability Baseline: <10 minutes Goal status: INITIAL  4.  Pt will improve LE MMT to no less than 4/5 for all tested motions for improved functional mobility and decrease fall risk Baseline: see MMT chart Goal status: INITIAL   PLAN:  PT FREQUENCY: 1-2x/week  PT DURATION: 8 weeks  PLANNED INTERVENTIONS: 97164- PT Re-evaluation, 97110-Therapeutic exercises, 97530- Therapeutic activity, 97112- Neuromuscular re-education, 97535- Self Care, [id number]- Manual therapy, [id number]- Gait training, [phone]- Canalith repositioning, 97016- Vasopneumatic device, Cryotherapy, and Moist heat  PLAN FOR NEXT SESSION: assess BP - HEP response, LE strengthening    Eloy End, PT 04/19/2023, 11:39 AM

## 2023-04-22 ENCOUNTER — Ambulatory Visit: Payer: Medicaid Other | Admitting: Physical Therapy

## 2023-04-22 ENCOUNTER — Encounter: Payer: Self-pay | Admitting: Physical Therapy

## 2023-04-22 DIAGNOSIS — R2689 Other abnormalities of gait and mobility: Secondary | ICD-10-CM

## 2023-04-22 DIAGNOSIS — M6281 Muscle weakness (generalized): Secondary | ICD-10-CM

## 2023-04-22 DIAGNOSIS — R2681 Unsteadiness on feet: Secondary | ICD-10-CM | POA: Diagnosis not present

## 2023-04-22 NOTE — Therapy (Signed)
OUTPATIENT PHYSICAL THERAPY TREATMENT   Patient Name: Michele George MRN: [id number] DOB:1961/01/11, 62 y.o., female Today's Date: 04/22/2023  END OF SESSION:  PT End of Session - 04/22/23 1146     Visit Number 6    Number of Visits 15    Date for PT Re-Evaluation 05/17/23    Authorization Type [place] [employer]    PT Start Time 1146    PT Stop Time 1227    PT Time Calculation (min) 41 min                Past Medical History:  Diagnosis Date   Hypertension    Past Surgical History:  Procedure Laterality Date   BREAST BIOPSY Right 04/01/2023   [place] RT BREAST BX W LOC DEV 1ST LESION IMG BX SPEC US GUIDE 04/01/2023 [facility]   COLONOSCOPY     TOTAL ABDOMINAL HYSTERECTOMY  2012   Patient Active Problem List   Diagnosis Date Noted   Hyperlipidemia LDL goal <100 09/30/2021   Need for Tdap vaccination 09/30/2021   Weakness of both lower extremities 09/30/2021   Essential hypertension 07/24/2021    PCP: Ngetich, Donalee Citrin, NP  REFERRING PROVIDER: Ngetich, Donalee Citrin, NP  REFERRING DIAG:  I10 (ICD-10-CM) - Uncontrolled hypertension R53.1 (ICD-10-CM) - Generalized weakness R29.6 (ICD-10-CM) - Falling episodes  THERAPY DIAG:  Muscle weakness (generalized)  Other abnormalities of gait and mobility  Unsteadiness on feet  Rationale for Evaluation and Treatment: Rehabilitation  ONSET DATE: Chronic  SUBJECTIVE:   SUBJECTIVE STATEMENT: Pt reports " just a little" improvement   PERTINENT HISTORY: HTN, mild CVA per patient  PAIN:  Are you having pain?  No: NPRS scale: 0/10  PRECAUTIONS: None  RED FLAGS: None   WEIGHT BEARING RESTRICTIONS: No  FALLS:  Has patient fallen in last 6 months? No - has had two close calls   LIVING ENVIRONMENT: Lives with: lives alone Lives in: House/apartment Stairs: Yes: Internal: 12 steps; on right going up Has following equipment at home: Single point cane  OCCUPATION: Not working  PLOF:  Independentz  PATIENT GOALS: Pt would like to get her functional activity tolerance and strength back   NEXT MD VISIT: 09/09/2022  OBJECTIVE:  Note: Objective measures were completed at Evaluation unless otherwise noted.  DIAGNOSTIC FINDINGS: N/A  VITALS: BP: 128/82- L arm manual cuff    PATIENT SURVEYS:  FOTO: 43% function; 54% predicted FOTO 57% goal met  COGNITION: Overall cognitive status: Within functional limits for tasks assessed     SENSATION: WFL  POSTURE: rounded shoulders and forward head  PALPATION: No TTP noted  LOWER EXTREMITY MMT:  MMT Right eval Left eval Right 04/15/23 Left 04/15/23  Hip flexion 3/5 3/5 4 4   Hip extension      Hip abduction 3/5 3/5    Hip adduction 3/5 3/5    Hip internal rotation      Hip external rotation      Knee flexion 4/5 4/5    Knee extension 4/5 4/5    Ankle dorsiflexion      Ankle plantarflexion      Ankle inversion      Ankle eversion       (Blank rows = not tested)  LOWER EXTREMITY SPECIAL TESTS:  DNT  FUNCTIONAL TESTS:  30 Second Sit to Stand: 5 reps - with UE:  04/15/23: 7 reps without UE 04/22/23: 8 reps without UE   GAIT: Distance walked: 18ft Assistive device utilized: Single point cane Level of  assistance: Modified independence Comments: trunk flexed, decreased gait speed   TREATMENT: [facility] Adult PT Treatment:                                                DATE: 04/22/23 BP: 125/80 Therapeutic Exercise: Nustep L 5 UE/LE x 5 minutes  Step up 10  x 2 each  STS 2  x 10  Side stepping at counter 2 passes, with red band 2 passes Heel raise x 20  Standing Row 10 x 2  Green  Standing Ext 10 x 2 Red Updated HEP    [facility] Adult PT Treatment:                                                DATE: 04/19/23 BP 129/89 Therapeutic Exercise: Nustep L5 5 minutes UE/LE while taking subjective STS with eccentric descent 2x10 Bridge with black band 2x10  Supine clamshell 2x15 black band Supine SLR 2x10  2# S/L hip abd 2x10 each Lateral walk RTB x 3 laps in // Standing hip abd/ext x 10 each RTB in // Neuromuscular Re-Ed: Tandem stance on foam 2x30" each FT on foam with head turns 2x30" Blue wobble board DF/PF 2x60"  [facility] Adult PT Treatment:                                                DATE: 04/15/23 BP 129/89 Therapeutic Exercise: Nustep L 5 5 minutes UE/LE  Heel raises 10 x 2  A/P blue rocker board x 2 minutes progressing from bilat UE to no UE Lateral blue rocking x 2 minutes progressing from bilat UE to no UE STS 10 x 2 - with cues for controlled descent  Bridge x 15 SLR x 15 each  Side hip abduction x 15 each     [facility] Adult PT Treatment:                                                DATE: 04/13/2023 Therapeutic Exercise: Rec bike lvl 2 x 3 min while taking subjective Lateral walk RTB x 2 laps Standing hip abduction/ext x 10 RTB LAQ 2x10 3# Supine SLR 2x15 each Bridge with ball 2x10 Neuromuscular Re-Ed: FT on foam x 30" FT on foam with head turns 2x30" Tandem stance on foam x 30" each Hurdle step over x 2 laps - 1UE support  [facility] Adult PT Treatment:                                                DATE: 04/08/2023 Therapeutic Exercise: NuStep lvl 5 UE/LE x 4 min while taking subjective Supine SLR 2x10 2# Bridge with blue band 2x10 Supine clamshell 2x15 blue band Lateral walk RTB x 3 laps  Neuromuscular Re-Ed: Tandem stance 2x30" each Tandem walk in // x 2 laps FT  on foam 2x30" FT on foam with head turns x 30"  [facility] Adult PT Treatment:                                                DATE: 03/22/2023 Therapeutic Exercise: Supine SLR x 5 each Bridge x 5 Supine clamshell x 5 GTB  PATIENT EDUCATION:  Education details: eval findings, FOTO, HEP, POC Person educated: Patient Education method: Explanation, Demonstration, and Handouts Education comprehension: verbalized understanding and returned demonstration  HOME EXERCISE PROGRAM: Access Code:  [email or web address] URL: [email or web address] Date: 04/08/2023 Prepared by: Edwinna Areola  Exercises - Active Straight Leg Raise with Quad Set  - 1 x daily - 7 x weekly - 2 sets - 10 reps - Supine Bridge  - 1 x daily - 7 x weekly - 2 sets - 10 reps - Hooklying Clamshell with Resistance  - 1 x daily - 7 x weekly - 3 sets - 10 reps - green band hold - Standing Tandem Balance with Counter Support  - 1 x daily - 7 x weekly - 2 reps - 30 sec hold   ASSESSMENT:  CLINICAL IMPRESSION: Pt was able to complete all prescribed exercises with no adverse effect. Therapy focused on functional strengthening. She demonstrates improved 30 sec STS to 8 reps. FOTO score also improved to target. Progressed HEP today. . Notes she feels like she is getting  a little stronger but Legs feel weak after 10 minutes on feet. Will continue to take BP before session and progress functional strengthening/endurance  as tolerated. BP has been normal last several visits.   EVAL: Patient is a 62 y.o. F who was seen today for physical therapy evaluation and treatment for chronic LE weakness and imbalance. Physical findings are consistent with referring provider as pt demonstrates decrease in LE MMT and functional mobility. FOTO score demonstrates decrease in subjective functional ability below PLOF. Of note, pt had elevated BP today as she had not yet taken her medication. PT advised pt to take medication at consistent times each day, will reach out to PCP to discuss. Pt would benefit from skilled PT services working on improving strength, balance, and mobility in order to increase safety.    OBJECTIVE IMPAIRMENTS: decreased activity tolerance, decreased balance, decreased mobility, difficulty walking, and decreased strength  ACTIVITY LIMITATIONS: carrying, lifting, standing, squatting, stairs, transfers, and locomotion level  PARTICIPATION LIMITATIONS: meal prep, cleaning, interpersonal relationship, driving, shopping, and  community activity  PERSONAL FACTORS: Time since onset of injury/illness/exacerbation and 1-2 comorbidities: HTN, mild CVA per patient  are also affecting patient's functional outcome.   REHAB POTENTIAL: Good  CLINICAL DECISION MAKING: Evolving/moderate complexity  EVALUATION COMPLEXITY: Moderate   GOALS: Goals reviewed with patient? No  SHORT TERM GOALS: Target date: 04/12/2023   Pt will be compliant and knowledgeable with initial HEP for improved comfort and carryover Baseline: initial HEP given  04/15/23: min compliance  Goal status: ONGOING  LONG TERM GOALS: Target date: 05/17/2023   Pt will improve FOTO function score to no less than 54% as proxy for functional improvement Baseline: 43% function 04/22/23: 57% Goal status: MET   2.  Pt will increase 30 Second Sit to Stand rep count to no less than 10 reps for improved balance, strength, and functional mobility Baseline: 5 reps with UE 04/15/23: 7 reps without UE  04/22/23: 8 reps  Goal  status: ONOING    3.  Pt will improve standing activity tolerance when performing home ADLs or community activities to no less than 30 minutes for improved functional ability Baseline: <10 minutes Goal status: INITIAL  4.  Pt will improve LE MMT to no less than 4/5 for all tested motions for improved functional mobility and decrease fall risk Baseline: see MMT chart Goal status: INITIAL   PLAN:  PT FREQUENCY: 1-2x/week  PT DURATION: 8 weeks  PLANNED INTERVENTIONS: 97164- PT Re-evaluation, 97110-Therapeutic exercises, 97530- Therapeutic activity, 97112- Neuromuscular re-education, 97535- Self Care, 16109- Manual therapy, [id number]- Gait training, [phone]- Canalith repositioning, 97016- Vasopneumatic device, Cryotherapy, and Moist heat  PLAN FOR NEXT SESSION: assess BP - HEP response, LE strengthening    Sherrie Mustache, PTA 04/22/2023, 1:31 PM

## 2023-04-26 ENCOUNTER — Other Ambulatory Visit: Payer: Medicaid Other

## 2023-04-26 ENCOUNTER — Ambulatory Visit: Payer: Medicaid Other

## 2023-04-26 DIAGNOSIS — R2681 Unsteadiness on feet: Secondary | ICD-10-CM | POA: Diagnosis not present

## 2023-04-26 DIAGNOSIS — M6281 Muscle weakness (generalized): Secondary | ICD-10-CM | POA: Diagnosis not present

## 2023-04-26 DIAGNOSIS — R2689 Other abnormalities of gait and mobility: Secondary | ICD-10-CM | POA: Diagnosis not present

## 2023-04-26 NOTE — Therapy (Signed)
 OUTPATIENT PHYSICAL THERAPY TREATMENT   Patient Name: Michele George MRN: [id number] DOB:[birth year], 62 y.o., female Today's Date: 04/26/2023  END OF SESSION:  PT End of Session - 04/26/23 1357     Visit Number 7    Number of Visits 15    Date for PT Re-Evaluation 05/17/23    Authorization Type [place] [employer]    PT Start Time 1400    PT Stop Time 1439    PT Time Calculation (min) 39 min                 Past Medical History:  Diagnosis Date   Hypertension    Past Surgical History:  Procedure Laterality Date   BREAST BIOPSY Right 04/01/2023   US  RT BREAST BX W LOC DEV 1ST LESION IMG BX SPEC US  GUIDE 04/01/2023 [facility]   COLONOSCOPY     TOTAL ABDOMINAL HYSTERECTOMY  2012   Patient Active Problem List   Diagnosis Date Noted   Hyperlipidemia LDL goal <100 09/30/2021   Need for Tdap vaccination 09/30/2021   Weakness of both lower extremities 09/30/2021   Essential hypertension 07/24/2021    PCP: Ngetich, Roxan BROCKS, NP  REFERRING PROVIDER: Ngetich, Roxan BROCKS, NP  REFERRING DIAG:  I10 (ICD-10-CM) - Uncontrolled hypertension R53.1 (ICD-10-CM) - Generalized weakness R29.6 (ICD-10-CM) - Falling episodes  THERAPY DIAG:  Muscle weakness (generalized)  Other abnormalities of gait and mobility  Unsteadiness on feet  Rationale for Evaluation and Treatment: Rehabilitation  ONSET DATE: Chronic  SUBJECTIVE:   SUBJECTIVE STATEMENT: Pt presents to PT with reports that she is doing pretty well and thinks she is getting a little better. Has been compliant with HEP.   PERTINENT HISTORY: HTN, mild CVA per patient  PAIN:  Are you having pain?  No: NPRS scale: 0/10  PRECAUTIONS: None  RED FLAGS: None   WEIGHT BEARING RESTRICTIONS: No  FALLS:  Has patient fallen in last 6 months? No - has had two close calls   LIVING ENVIRONMENT: Lives with: lives alone Lives in: House/apartment Stairs: Yes: Internal: 12 steps; on right going up Has  following equipment at home: Single point cane  OCCUPATION: Not working  PLOF: Independentz  PATIENT GOALS: Pt would like to get her functional activity tolerance and strength back   NEXT MD VISIT: 09/09/2022  OBJECTIVE:  Note: Objective measures were completed at Evaluation unless otherwise noted.  DIAGNOSTIC FINDINGS: N/A  VITALS: BP: 128/82- L arm manual cuff    PATIENT SURVEYS:  FOTO: 43% function; 54% predicted FOTO 57% goal met  COGNITION: Overall cognitive status: Within functional limits for tasks assessed     SENSATION: WFL  POSTURE: rounded shoulders and forward head  PALPATION: No TTP noted  LOWER EXTREMITY MMT:  MMT Right eval Left eval Right 04/15/23 Left 04/15/23  Hip flexion 3/5 3/5 4 4   Hip extension      Hip abduction 3/5 3/5    Hip adduction 3/5 3/5    Hip internal rotation      Hip external rotation      Knee flexion 4/5 4/5    Knee extension 4/5 4/5    Ankle dorsiflexion      Ankle plantarflexion      Ankle inversion      Ankle eversion       (Blank rows = not tested)  LOWER EXTREMITY SPECIAL TESTS:  DNT  FUNCTIONAL TESTS:  30 Second Sit to Stand: 5 reps - with UE:  04/15/23: 7 reps without UE  04/22/23: 8 reps without UE   GAIT: Distance walked: 65ft Assistive device utilized: Single point cane Level of assistance: Modified independence Comments: trunk flexed, decreased gait speed   TREATMENT: [facility] Adult PT Treatment:                                                DATE: 04/26/23 BP: 128/98 - same with two attempts Therapeutic Exercise: Nustep L 5 UE/LE x 5 minutes while taking subjective Step up x 10 8in fwd each Tandem on foam 2x30 each Lateral walk RTB x 3 laps in // Standing hip abd RTB x 10 each STS 2x10 5# - high table LAQ 2x10 4# Seated hamstring curl 2x10 blue band  [facility] Adult PT Treatment:                                                DATE: 04/22/23 BP: 125/80 Therapeutic Exercise: Nustep L 5 UE/LE x 5  minutes  Step up 10  x 2 each  STS 2  x 10  Side stepping at counter 2 passes, with red band 2 passes Heel raise x 20  Standing Row 10 x 2  Green  Standing Ext 10 x 2 Red Updated HEP  [facility] Adult PT Treatment:                                                DATE: 04/19/23 BP 129/89 Therapeutic Exercise: Nustep L5 5 minutes UE/LE while taking subjective STS with eccentric descent 2x10 Bridge with black band 2x10  Supine clamshell 2x15 black band Supine SLR 2x10 2# S/L hip abd 2x10 each Lateral walk RTB x 3 laps in // Standing hip abd/ext x 10 each RTB in // Neuromuscular Re-Ed: Tandem stance on foam 2x30 each FT on foam with head turns 2x30 Blue wobble board DF/PF 2x60  [facility] Adult PT Treatment:                                                DATE: 04/15/23 BP 129/89 Therapeutic Exercise: Nustep L 5 5 minutes UE/LE  Heel raises 10 x 2  A/P blue rocker board x 2 minutes progressing from bilat UE to no UE Lateral blue rocking x 2 minutes progressing from bilat UE to no UE STS 10 x 2 - with cues for controlled descent  Bridge x 15 SLR x 15 each  Side hip abduction x 15 each     [facility] Adult PT Treatment:                                                DATE: 04/13/2023 Therapeutic Exercise: Rec bike lvl 2 x 3 min while taking subjective Lateral walk RTB x 2 laps Standing hip abduction/ext x 10 RTB LAQ 2x10 3# Supine SLR 2x15 each Bridge with ball  2x10 Neuromuscular Re-Ed: FT on foam x 30 FT on foam with head turns 2x30 Tandem stance on foam x 30 each Hurdle step over x 2 laps - 1UE support  [facility] Adult PT Treatment:                                                DATE: 04/08/2023 Therapeutic Exercise: NuStep lvl 5 UE/LE x 4 min while taking subjective Supine SLR 2x10 2# Bridge with blue band 2x10 Supine clamshell 2x15 blue band Lateral walk RTB x 3 laps  Neuromuscular Re-Ed: Tandem stance 2x30 each Tandem walk in // x 2 laps FT on foam 2x30 FT on foam with  head turns x 30  [facility] Adult PT Treatment:                                                DATE: 03/22/2023 Therapeutic Exercise: Supine SLR x 5 each Bridge x 5 Supine clamshell x 5 GTB  PATIENT EDUCATION:  Education details: eval findings, FOTO, HEP, POC Person educated: Patient Education method: Explanation, Demonstration, and Handouts Education comprehension: verbalized understanding and returned demonstration  HOME EXERCISE PROGRAM: Access Code: [email or web address] URL: [email or web address] Date: 04/26/2023 Prepared by: Alm Kingdom  Exercises - Active Straight Leg Raise with Quad Set  - 1 x daily - 7 x weekly - 2 sets - 10 reps - Supine Bridge  - 1 x daily - 7 x weekly - 2 sets - 10 reps - Hooklying Clamshell with Resistance  - 1 x daily - 7 x weekly - 3 sets - 10 reps - green band hold - Standing Tandem Balance with Counter Support  - 1 x daily - 7 x weekly - 2 reps - 30 sec hold - Side Stepping with Resistance at Ankles and Counter Support  - 1 x daily - 7 x weekly - 2 sets - 10 reps - Standing Row with Anchored Resistance  - 1 x daily - 7 x weekly - 2-3 sets - 10 reps   ASSESSMENT:  CLINICAL IMPRESSION: Pt was able to complete all prescribed exercises with no adverse effect. Therapy focused on functional strengthening and static balance today, with progression of exercises tolerated well. PT will review goals at next session as pt has dental surgery on 05/04/2023 and wants to be done with PT before. Will continue to progress per POC.   EVAL: Patient is a 62 y.o. F who was seen today for physical therapy evaluation and treatment for chronic LE weakness and imbalance. Physical findings are consistent with referring provider as pt demonstrates decrease in LE MMT and functional mobility. FOTO score demonstrates decrease in subjective functional ability below PLOF. Of note, pt had elevated BP today as she had not yet taken her medication. PT advised pt to take medication at  consistent times each day, will reach out to PCP to discuss. Pt would benefit from skilled PT services working on improving strength, balance, and mobility in order to increase safety.    OBJECTIVE IMPAIRMENTS: decreased activity tolerance, decreased balance, decreased mobility, difficulty walking, and decreased strength  ACTIVITY LIMITATIONS: carrying, lifting, standing, squatting, stairs, transfers, and locomotion level  PARTICIPATION LIMITATIONS: meal prep, cleaning, interpersonal relationship, driving, shopping, and community activity  PERSONAL FACTORS: Time since onset of injury/illness/exacerbation and 1-2 comorbidities: HTN, mild CVA per patient  are also affecting patient's functional outcome.   REHAB POTENTIAL: Good  CLINICAL DECISION MAKING: Evolving/moderate complexity  EVALUATION COMPLEXITY: Moderate   GOALS: Goals reviewed with patient? No  SHORT TERM GOALS: Target date: 04/12/2023   Pt will be compliant and knowledgeable with initial HEP for improved comfort and carryover Baseline: initial HEP given  04/15/23: min compliance  Goal status: ONGOING  LONG TERM GOALS: Target date: 05/17/2023   Pt will improve FOTO function score to no less than 54% as proxy for functional improvement Baseline: 43% function 04/22/23: 57% Goal status: MET   2.  Pt will increase 30 Second Sit to Stand rep count to no less than 10 reps for improved balance, strength, and functional mobility Baseline: 5 reps with UE 04/15/23: 7 reps without UE  04/22/23: 8 reps  Goal status: ONOING    3.  Pt will improve standing activity tolerance when performing home ADLs or community activities to no less than 30 minutes for improved functional ability Baseline: <10 minutes Goal status: INITIAL  4.  Pt will improve LE MMT to no less than 4/5 for all tested motions for improved functional mobility and decrease fall risk Baseline: see MMT chart Goal status: INITIAL   PLAN:  PT FREQUENCY:  1-2x/week  PT DURATION: 8 weeks  PLANNED INTERVENTIONS: 97164- PT Re-evaluation, 97110-Therapeutic exercises, 97530- Therapeutic activity, 97112- Neuromuscular re-education, 97535- Self Care, 02859- Manual therapy, [id number]- Gait training, [phone]- Canalith repositioning, 97016- Vasopneumatic device, Cryotherapy, and Moist heat  PLAN FOR NEXT SESSION: assess BP - HEP response, LE strengthening    Alm JAYSON Kingdom, PT 04/26/2023, 2:44 PM

## 2023-05-03 NOTE — H&P (Signed)
 Date: February 24, 2023   Patient: Michele George  PID: [id number]  DOB: 1961-03-22  SEX: Female   Patient referred by DDS for extraction of remaining teeth  CC: Bad teeth  Past Medical History:  High Blood Pressure, Smoker, Stroke, Pain and Clicking of Jaw, Obese    Medications: Amlodipine , Lipitor, Lopressor     Allergies:     None    Surgeries:   Colonoscopy, TAH     Social History       Smoking:            Alcohol: Drug use:                             Exam: BMI 30. Gross decay and periodontal disease remaining teeth # 5, 11, 12, 18, 19, 22, 27, 28. Exostosis left posterior maxilla.  No purulence, edema, fluctuance, trismus. Oral cancer screening negative. Pharynx clear. No lymphadenopathy.  Panorex: Multiple  decay and bone loss remaining teeth # 5, 11, 12, 18, 19, 22, 27, 28. Exostosis left posterior maxilla.   Assessment: ASA 2. Non-restorable  teeth # 5, 11, 12, 18, 19, 22, 27, 28. Exostosis left posterior maxilla.                Plan: 1. MD Clearance  2. Extraction Teeth #   5, 11, 12, 18, 19, 22, 27, 28. Alveoloplasty. Removal  exostosis left  maxilla. Hospital Day surgery.                 Rx: n               Risks and complications explained. Questions answered.   Glendia EMERSON Primrose, DMD

## 2023-05-04 ENCOUNTER — Ambulatory Visit: Payer: Medicaid Other | Attending: Family | Admitting: Physical Therapy

## 2023-05-04 DIAGNOSIS — R2689 Other abnormalities of gait and mobility: Secondary | ICD-10-CM

## 2023-05-04 DIAGNOSIS — R2681 Unsteadiness on feet: Secondary | ICD-10-CM

## 2023-05-04 DIAGNOSIS — M6281 Muscle weakness (generalized): Secondary | ICD-10-CM

## 2023-05-04 NOTE — Therapy (Addendum)
 OUTPATIENT PHYSICAL THERAPY TREATMENT/DISCHARGE  PHYSICAL THERAPY DISCHARGE SUMMARY  Visits from Start of Care: 8  Current functional level related to goals / functional outcomes: See goals and objective   Remaining deficits: See goals and objective   Education / Equipment: HEP   Patient agrees to discharge. Patient goals were  mostly met . Patient is being discharged due to being pleased with the current functional level.   Patient Name: Michele George MRN: [id number] DOB:10-Feb-1961, 63 y.o., female Today's Date: 05/04/2023  END OF SESSION:  PT End of Session - 05/04/23 1146     Visit Number 8    Number of Visits 15    Date for PT Re-Evaluation 05/17/23    Authorization Type [place] [employer]    PT Start Time 1145    PT Stop Time 1223    PT Time Calculation (min) 38 min                 Past Medical History:  Diagnosis Date   Hypertension    Past Surgical History:  Procedure Laterality Date   BREAST BIOPSY Right 04/01/2023   US  RT BREAST BX W LOC DEV 1ST LESION IMG BX SPEC US  GUIDE 04/01/2023 [facility]   COLONOSCOPY     TOTAL ABDOMINAL HYSTERECTOMY  2012   Patient Active Problem List   Diagnosis Date Noted   Hyperlipidemia LDL goal <100 09/30/2021   Need for Tdap vaccination 09/30/2021   Weakness of both lower extremities 09/30/2021   Essential hypertension 07/24/2021    PCP: Ngetich, Roxan BROCKS, NP  REFERRING PROVIDER: Ngetich, Roxan BROCKS, NP  REFERRING DIAG:  I10 (ICD-10-CM) - Uncontrolled hypertension R53.1 (ICD-10-CM) - Generalized weakness R29.6 (ICD-10-CM) - Falling episodes  THERAPY DIAG:  Muscle weakness (generalized)  Other abnormalities of gait and mobility  Unsteadiness on feet  Rationale for Evaluation and Treatment: Rehabilitation  ONSET DATE: Chronic  SUBJECTIVE:   SUBJECTIVE STATEMENT: Pt presents to PT with reports that she is doing pretty well and thinks she is getting a little better. Has been  more compliant  with HEP.   PERTINENT HISTORY: HTN, mild CVA per patient  PAIN:  Are you having pain?  No: NPRS scale: 0/10  PRECAUTIONS: None  RED FLAGS: None   WEIGHT BEARING RESTRICTIONS: No  FALLS:  Has patient fallen in last 6 months? No - has had two close calls   LIVING ENVIRONMENT: Lives with: lives alone Lives in: House/apartment Stairs: Yes: Internal: 12 steps; on right going up Has following equipment at home: Single point cane  OCCUPATION: Not working  PLOF: Independentz  PATIENT GOALS: Pt would like to get her functional activity tolerance and strength back   NEXT MD VISIT: 09/09/2022  OBJECTIVE:  Note: Objective measures were completed at Evaluation unless otherwise noted.  DIAGNOSTIC FINDINGS: N/A  VITALS: BP: 128/82- L arm manual cuff    PATIENT SURVEYS:  FOTO: 43% function; 54% predicted FOTO 57% goal met  COGNITION: Overall cognitive status: Within functional limits for tasks assessed     SENSATION: WFL  POSTURE: rounded shoulders and forward head  PALPATION: No TTP noted  LOWER EXTREMITY MMT:  MMT Right eval Left eval Right 04/15/23 Left 04/15/23 Right 05/04/23 Left 05/04/23  Hip flexion 3/5 3/5 4 4 4 4   Hip extension     3+ 3+  Hip abduction 3/5 3/5   4 4   Hip adduction 3/5 3/5      Hip internal rotation        Hip  external rotation        Knee flexion 4/5 4/5      Knee extension 4/5 4/5      Ankle dorsiflexion        Ankle plantarflexion        Ankle inversion        Ankle eversion         (Blank rows = not tested)  LOWER EXTREMITY SPECIAL TESTS:  DNT  FUNCTIONAL TESTS:  30 Second Sit to Stand: 5 reps - with UE:  04/15/23: 7 reps without UE 04/22/23: 8 reps without UE; 05/04/23: 9 reps 05/04/23: tandem stance 10 sec   GAIT: Distance walked: 78ft Assistive device utilized: Single point cane Level of assistance: Modified independence Comments: trunk flexed, decreased gait speed   TREATMENT: [facility] Adult PT Treatment:                                                 DATE: 05/04/23 Therapeutic Exercise: Nustep L5 UE/LE x 5 minutes STS x 10 Prone hip ext x 10 each  SLR x 10 each  Supine clam green band  Bridge 10 x 2  Side stepping  GTB at thighs  Row GTB x 15  Therapeutic Activity: 30 sec sit -stand x 9   [facility] Adult PT Treatment:                                                DATE: 04/26/23 BP: 128/98 - same with two attempts Therapeutic Exercise: Nustep L 5 UE/LE x 5 minutes while taking subjective Step up x 10 8in fwd each Tandem on foam 2x30 each Lateral walk RTB x 3 laps in // Standing hip abd RTB x 10 each STS 2x10 5# - high table LAQ 2x10 4# Seated hamstring curl 2x10 blue band  [facility] Adult PT Treatment:                                                DATE: 04/22/23 BP: 125/80 Therapeutic Exercise: Nustep L 5 UE/LE x 5 minutes  Step up 10  x 2 each  STS 2  x 10  Side stepping at counter 2 passes, with red band 2 passes Heel raise x 20  Standing Row 10 x 2  Green  Standing Ext 10 x 2 Red Updated HEP      PATIENT EDUCATION:  Education details: eval findings, FOTO, HEP, POC Person educated: Patient Education method: Explanation, Demonstration, and Handouts Education comprehension: verbalized understanding and returned demonstration  HOME EXERCISE PROGRAM: Access Code: [email or web address] URL: [email or web address] Date: 04/26/2023 Prepared by: Alm Kingdom  Exercises - Active Straight Leg Raise with Quad Set  - 1 x daily - 7 x weekly - 2 sets - 10 reps - Supine Bridge  - 1 x daily - 7 x weekly - 2 sets - 10 reps - Hooklying Clamshell with Resistance  - 1 x daily - 7 x weekly - 3 sets - 10 reps - green band hold - Standing Tandem Balance with Counter Support  - 1 x daily - 7 x weekly -  2 reps - 30 sec hold - Side Stepping with Resistance at Ankles and Counter Support  - 1 x daily - 7 x weekly - 2 sets - 10 reps - Standing Row with Anchored Resistance  - 1 x daily - 7 x weekly - 2-3  sets - 10 reps   ASSESSMENT:  CLINICAL IMPRESSION: Pt was able to complete all prescribed exercises with no adverse effect. Therapy focused on review of HEP and check of LTGs. Pt reports activity tolerance slightly improved from 10 minutes to 15 minutes. Her hip strength has improved for flexion and abduction although continued weakness with hip extension. She did meet her FOTO goal. STS reps also improved from 5 reps to 9 reps in 30 seconds. She has dental surgery later this week and would like to DC from formal PT at this time.   EVAL: Patient is a 63 y.o. F who was seen today for physical therapy evaluation and treatment for chronic LE weakness and imbalance. Physical findings are consistent with referring provider as pt demonstrates decrease in LE MMT and functional mobility. FOTO score demonstrates decrease in subjective functional ability below PLOF. Of note, pt had elevated BP today as she had not yet taken her medication. PT advised pt to take medication at consistent times each day, will reach out to PCP to discuss. Pt would benefit from skilled PT services working on improving strength, balance, and mobility in order to increase safety.    OBJECTIVE IMPAIRMENTS: decreased activity tolerance, decreased balance, decreased mobility, difficulty walking, and decreased strength  ACTIVITY LIMITATIONS: carrying, lifting, standing, squatting, stairs, transfers, and locomotion level  PARTICIPATION LIMITATIONS: meal prep, cleaning, interpersonal relationship, driving, shopping, and community activity  PERSONAL FACTORS: Time since onset of injury/illness/exacerbation and 1-2 comorbidities: HTN, mild CVA per patient  are also affecting patient's functional outcome.   REHAB POTENTIAL: Good  CLINICAL DECISION MAKING: Evolving/moderate complexity  EVALUATION COMPLEXITY: Moderate   GOALS: Goals reviewed with patient? No  SHORT TERM GOALS: Target date: 04/12/2023   Pt will be compliant and  knowledgeable with initial HEP for improved comfort and carryover Baseline: initial HEP given  04/15/23: min compliance 05/04/23 Mod compliance  Goal status: MET  LONG TERM GOALS: Target date: 05/17/2023   Pt will improve FOTO function score to no less than 54% as proxy for functional improvement Baseline: 43% function 04/22/23: 57% Goal status: MET   2.  Pt will increase 30 Second Sit to Stand rep count to no less than 10 reps for improved balance, strength, and functional mobility Baseline: 5 reps with UE 04/15/23: 7 reps without UE  04/22/23: 8 reps  05/04/23: 9 reps Goal status: NEARLY MET  3.  Pt will improve standing activity tolerance when performing home ADLs or community activities to no less than 30 minutes for improved functional ability Baseline: <10 minutes 05/04/23: 15 minutes  Goal status: NOT MET, improved   4.  Pt will improve LE MMT to no less than 4/5 for all tested motions for improved functional mobility and decrease fall risk Baseline: see MMT chart 05/04/23: hip ext 3+ Goal status: PARTIALLY MET    PLAN:  PT FREQUENCY: 1-2x/week  PT DURATION: 8 weeks  PLANNED INTERVENTIONS: 97164- PT Re-evaluation, 97110-Therapeutic exercises, 97530- Therapeutic activity, 97112- Neuromuscular re-education, 97535- Self Care, 02859- Manual therapy, [id number]- Gait training, [phone]- Canalith repositioning, 97016- Vasopneumatic device, Cryotherapy, and Moist heat  PLAN FOR NEXT SESSION: N/A , DC to HEP    Harlene Persons, PTA 05/04/23 12:35 PM Phone:  [phone] Fax: [phone]

## 2023-05-05 ENCOUNTER — Encounter (HOSPITAL_COMMUNITY): Payer: Self-pay | Admitting: Anesthesiology

## 2023-05-05 ENCOUNTER — Encounter (HOSPITAL_COMMUNITY): Payer: Self-pay | Admitting: Oral Surgery

## 2023-05-05 ENCOUNTER — Other Ambulatory Visit: Payer: Self-pay

## 2023-05-05 NOTE — Anesthesia Preprocedure Evaluation (Signed)
 Anesthesia Evaluation  Patient identified by MRN, date of birth, ID band Patient awake    Reviewed: Allergy & Precautions, NPO status , Patient's Chart, lab work & pertinent test results  Airway        Dental   Pulmonary Current Smoker          Cardiovascular hypertension (metoprolol ), Pt. on home beta blockers   HLD   Neuro/Psych    GI/Hepatic   Endo/Other    Renal/GU      Musculoskeletal   Abdominal   Peds  Hematology Lab Results      Component                Value               Date                      WBC                      6.5                 03/01/2023                HGB                      14.3                03/01/2023                HCT                      44.6                03/01/2023                MCV                      93.5                03/01/2023                PLT                      368                 03/01/2023              Anesthesia Other Findings Falling episodes  Reproductive/Obstetrics                              Anesthesia Physical Anesthesia Plan  ASA: 2  Anesthesia Plan: General   Post-op Pain Management: Tylenol  PO (pre-op)*   Induction: Intravenous  PONV Risk Score and Plan: 2 and Ondansetron , Dexamethasone  and Treatment may vary due to age or medical condition  Airway Management Planned: Nasal ETT  Additional Equipment:   Intra-op Plan:   Post-operative Plan: Extubation in OR  Informed Consent:      Dental advisory given  Plan Discussed with: CRNA and Anesthesiologist  Anesthesia Plan Comments: (Risks of general anesthesia discussed including, but not limited to, sore throat, hoarse voice, chipped/damaged teeth, injury to vocal cords, nausea and vomiting, allergic reactions, lung infection, heart attack, stroke, and death. All questions answered. )         Anesthesia Quick Evaluation

## 2023-05-05 NOTE — Progress Notes (Signed)
 PCP - Ngetich, Roxan BROCKS, NP  Cardiologist - denies  PPM/ICD - denies Device Orders - n/a Rep Notified - n/a  Chest x-ray - denies EKG - 03-07-23 Stress Test - denies ECHO - denies Cardiac Cath - denies  CPAP - denies  Dm -denies  Blood Thinner Instructions: denies Aspirin Instructions: n/a  ERAS Protcol - NPO  COVID TEST- n/a  Anesthesia review: no  Patient verbally denies any shortness of breath, fever, cough and chest pain during phone call   -------------  SDW INSTRUCTIONS given:  Your procedure is scheduled on May 06, 2023.  Report to [facility] Main Entrance A at 6:15 A.M., and check in at the Admitting office.  Call this number if you have problems the morning of surgery:  [phone]   Remember:  Do not eat or drink after midnight the night before your surgery      Take these medicines the morning of surgery with A SIP OF WATER  metoprolol  tartrate (LOPRESSOR )  atorvastatin  (LIPITOR)  amLODipine  (NORVASC )  IF NEEDED     As of today, STOP taking any Aspirin (unless otherwise instructed by your surgeon) Aleve, Naproxen, Ibuprofen, Motrin, Advil, Goody's, BC's, all herbal medications, fish oil, and all vitamins.                      Do not wear jewelry, make up, or nail polish            Do not wear lotions, powders, perfumes/colognes, or deodorant.            Do not shave 48 hours prior to surgery.  Men may shave face and neck.            Do not bring valuables to the hospital.            [facility] is not responsible for any belongings or valuables.  Do NOT Smoke (Tobacco/Vaping) 24 hours prior to your procedure If you use a CPAP at night, you may bring all equipment for your overnight stay.   Contacts, glasses, dentures or bridgework may not be worn into surgery.      For patients admitted to the hospital, discharge time will be determined by your treatment team.   Patients discharged the day of surgery will not be allowed to drive  home, and someone needs to stay with them for 24 hours.    Special instructions:   [place]- Preparing For Surgery  Before surgery, you can play an important role. Because skin is not sterile, your skin needs to be as free of germs as possible. You can reduce the number of germs on your skin by washing with CHG (chlorahexidine gluconate) Soap before surgery.  CHG is an antiseptic cleaner which kills germs and bonds with the skin to continue killing germs even after washing.    Oral Hygiene is also important to reduce your risk of infection.  Remember - BRUSH YOUR TEETH THE MORNING OF SURGERY WITH YOUR REGULAR TOOTHPASTE  Please do not use if you have an allergy to CHG or antibacterial soaps. If your skin becomes reddened/irritated stop using the CHG.  Do not shave (including legs and underarms) for at least 48 hours prior to first CHG shower. It is OK to shave your face.  Please follow these instructions carefully.   Shower the NIGHT BEFORE SURGERY and the MORNING OF SURGERY with DIAL Soap.   Pat yourself dry with a CLEAN TOWEL.  Wear CLEAN PAJAMAS to  bed the night before surgery  Place CLEAN SHEETS on your bed the night of your first shower and DO NOT SLEEP WITH PETS.   Day of Surgery: Please shower morning of surgery  Wear Clean/Comfortable clothing the morning of surgery Do not apply any deodorants/lotions.   Remember to brush your teeth WITH YOUR REGULAR TOOTHPASTE.   Questions were answered. Patient verbalized understanding of instructions.

## 2023-05-06 ENCOUNTER — Encounter (HOSPITAL_COMMUNITY): Payer: Self-pay | Admitting: Oral Surgery

## 2023-05-06 ENCOUNTER — Ambulatory Visit (HOSPITAL_COMMUNITY)
Admission: RE | Admit: 2023-05-06 | Discharge: 2023-05-06 | Disposition: A | Discharge status: Home / Self Care | Payer: Medicaid Other | Attending: Oral Surgery | Admitting: Oral Surgery

## 2023-05-06 ENCOUNTER — Other Ambulatory Visit: Payer: Self-pay

## 2023-05-06 ENCOUNTER — Encounter (HOSPITAL_COMMUNITY)
Admission: RE | Disposition: A | Discharge status: Home / Self Care | Payer: Self-pay | Source: Home / Self Care | Attending: Oral Surgery

## 2023-05-06 DIAGNOSIS — K029 Dental caries, unspecified: Secondary | ICD-10-CM | POA: Insufficient documentation

## 2023-05-06 DIAGNOSIS — Z538 Procedure and treatment not carried out for other reasons: Secondary | ICD-10-CM | POA: Insufficient documentation

## 2023-05-06 LAB — BASIC METABOLIC PANEL
Anion gap: 12 (ref 5–15)
BUN: 7 mg/dL (ref 6–20)
CO2: 20 mmol/L — ABNORMAL LOW (ref 22–32)
Calcium: 9.1 mg/dL (ref 8.9–10.3)
Chloride: 106 mmol/L (ref 98–111)
Creatinine, Ser: 0.77 mg/dL (ref 0.44–1.00)
GFR, Estimated: >60 mL/min (ref >60)
Glucose, Bld: 108 mg/dL — ABNORMAL HIGH (ref 70–99)
Potassium: 4.2 mmol/L (ref 3.5–5.1)
Sodium: 138 mmol/L (ref 135–145)

## 2023-05-06 LAB — CBC
HCT: 43.1 % (ref 36.0–46.0)
Hemoglobin: 14 g/dL (ref 12.0–15.0)
MCH: 30.5 pg (ref 26.0–34.0)
MCHC: 32.5 g/dL (ref 30.0–36.0)
MCV: 93.9 fL (ref 80.0–100.0)
Platelets: 276 10*3/uL (ref 150–400)
RBC: 4.59 MIL/uL (ref 3.87–5.11)
RDW: 12.8 % (ref 11.5–15.5)
WBC: 7.7 10*3/uL (ref 4.0–10.5)
nRBC: 0 % (ref 0.0–0.2)

## 2023-05-06 SURGERY — DENTAL RESTORATION/EXTRACTIONS
Anesthesia: General

## 2023-05-06 MED ORDER — ORAL CARE MOUTH RINSE: 15.0000 mL | Freq: Once | OROMUCOSAL | Status: DC

## 2023-05-06 MED ORDER — CHLORHEXIDINE GLUCONATE 0.12 % MT SOLN
15.0000 mL | Freq: Once | OROMUCOSAL | Status: DC
Start: 2023-05-06 — End: 2023-05-06
  Filled 2023-05-06: qty 15

## 2023-05-06 MED ORDER — LACTATED RINGERS IV SOLN
INTRAVENOUS | Status: DC
Start: 2023-05-06 — End: 2023-05-06

## 2023-05-06 MED ORDER — CEFAZOLIN SODIUM-DEXTROSE 2-4 GM/100ML-% IV SOLN
2.0000 g | INTRAVENOUS | Status: DC
  Filled 2023-05-06: qty 100

## 2023-05-06 MED ORDER — ACETAMINOPHEN 500 MG PO TABS
1000.0000 mg | ORAL_TABLET | Freq: Once | ORAL | Status: DC
  Filled 2023-05-06: qty 2

## 2023-05-06 NOTE — Progress Notes (Signed)
 Pt c/o cough and stuffy nose since yesterday. Pt denies other symptoms. Dr Isaias Cowman notified. Pt surgery cancelled for today, to be re-scheduled for when patient is feeling better. IV removed. Pt discharged with family.

## 2023-06-07 NOTE — H&P (Signed)
Date: February 24, 2023   Patient: Michele George  PID: [id number]  DOB: 1960/07/30  SEX: Female   Patient referred by Alberteen Spindle, DDS for extraction of remaining teeth  CC: Bad teeth  Past Medical History:  High Blood Pressure, Smoker, Stroke, Pain and Clicking of Jaw, Obese    Medications: Amlodipine, Lipitor, Lopressor    Allergies:     None    Surgeries:   Colonoscopy, TAH     Social History       Smoking:            Alcohol: Drug use:                             Exam: BMI 30. Gross decay and periodontal disease remaining teeth # 5, 11, 12, 18, 19, 22, 27, 28. Exostosis left posterior maxilla.  No purulence, edema, fluctuance, trismus. Oral cancer screening negative. Pharynx clear. No lymphadenopathy.  Panorex: Multiple  decay and bone loss remaining teeth # 5, 11, 12, 18, 19, 22, 27, 28. Exostosis left posterior maxilla.   Assessment: ASA 2. Non-restorable  teeth # 5, 11, 12, 18, 19, 22, 27, 28. Exostosis left posterior maxilla.                Plan: 1. MD Clearance obtained  2. Extraction Teeth #   5, 11, 12, 18, 19, 22, 27, 28. Alveoloplasty. Removal  exostosis left  maxilla. Hospital Day surgery.                 Rx: n               Risks and complications explained. Questions answered.   Georgia Lopes, DMD

## 2023-06-09 ENCOUNTER — Encounter (HOSPITAL_COMMUNITY): Payer: Self-pay | Admitting: Oral Surgery

## 2023-06-09 ENCOUNTER — Other Ambulatory Visit: Payer: Self-pay

## 2023-06-09 NOTE — Progress Notes (Signed)
SDW call  Patient was given pre-op instructions over the phone. Patient verbalized understanding of instructions provided.     PCP - Richarda Blade, NP Cardiologist -  Pulmonary:    PPM/ICD - denies Device Orders - na Rep Notified - na   Chest x-ray - na EKG -  03/07/2023 Stress Test - ECHO -  Cardiac Cath -   Sleep Study/sleep apnea/CPAP: denies  Non-diabetic  Blood Thinner Instructions: denies Aspirin Instructions:denies   ERAS Protcol - NPO   Anesthesia review: No   Patient denies shortness of breath, fever, cough and chest pain over the phone call  Your procedure is scheduled on Friday June 10, 2023  Report to [facility] Main Entrance "A" at  0630  A.M., then check in with the Admitting office.  Call this number if you have problems the morning of surgery:  [phone]   If you have any questions prior to your surgery date call [phone]: Open Monday-Friday 8am-4pm If you experience any cold or flu symptoms such as cough, fever, chills, shortness of breath, etc. between now and your scheduled surgery, please notify us at the above number    Remember:  Do not eat or drink after midnight the night before your surgery  Take these medicines the morning of surgery with A SIP OF WATER:  Atorvastatin, metoprolol  As needed: tylenol  As of today, STOP taking any Aspirin (unless otherwise instructed by your surgeon) Aleve, Naproxen, Ibuprofen, Motrin, Advil, Goody's, BC's, all herbal medications, fish oil, and all vitamins.

## 2023-06-09 NOTE — Anesthesia Preprocedure Evaluation (Signed)
Anesthesia Evaluation  Patient identified by MRN, date of birth, ID band Patient awake    Reviewed: Allergy & Precautions, NPO status , Patient's Chart, lab work & pertinent test results  Airway Mallampati: II  TM Distance: >3 FB Neck ROM: Full    Dental no notable dental hx. (+) Poor Dentition, Dental Advisory Given, Missing, Chipped   Pulmonary Current Smoker and Patient abstained from smoking.   Pulmonary exam normal breath sounds clear to auscultation       Cardiovascular hypertension, (-) angina (-) Past MI Normal cardiovascular exam Rhythm:Regular Rate:Normal     Neuro/Psych CVA, No Residual Symptoms  negative psych ROS   GI/Hepatic negative GI ROS, Neg liver ROS,,,  Endo/Other  negative endocrine ROS    Renal/GU negative Renal ROSLab Results      Component                Value               Date                         K                        4.2                 05/06/2023                      CREATININE               0.77                05/06/2023                   Musculoskeletal negative musculoskeletal ROS (+)    Abdominal   Peds  Hematology negative hematology ROS (+) Lab Results      Component                Value               Date                      WBC                      7.7                 05/06/2023                HGB                      14.0                05/06/2023                HCT                      43.1                05/06/2023                MCV                      93.9                05/06/2023  PLT                      276                 05/06/2023              Anesthesia Other Findings   Reproductive/Obstetrics                             Anesthesia Physical Anesthesia Plan  ASA: 3  Anesthesia Plan: General   Post-op Pain Management:    Induction: Intravenous  PONV Risk Score and Plan: Treatment may vary due to age or medical  condition, Midazolam and Ondansetron  Airway Management Planned: Oral ETT and Nasal ETT  Additional Equipment: None  Intra-op Plan:   Post-operative Plan: Extubation in OR  Informed Consent: I have reviewed the patients History and Physical, chart, labs and discussed the procedure including the risks, benefits and alternatives for the proposed anesthesia with the patient or authorized representative who has indicated his/her understanding and acceptance.     Dental advisory given  Plan Discussed with: CRNA and Surgeon  Anesthesia Plan Comments:        Anesthesia Quick Evaluation

## 2023-06-10 ENCOUNTER — Ambulatory Visit (HOSPITAL_BASED_OUTPATIENT_CLINIC_OR_DEPARTMENT_OTHER): Payer: Self-pay | Admitting: Anesthesiology

## 2023-06-10 ENCOUNTER — Encounter (HOSPITAL_COMMUNITY): Payer: Self-pay | Admitting: Oral Surgery

## 2023-06-10 ENCOUNTER — Encounter (HOSPITAL_COMMUNITY)
Admission: RE | Disposition: A | Discharge status: Home / Self Care | Payer: Self-pay | Source: Home / Self Care | Attending: Oral Surgery

## 2023-06-10 ENCOUNTER — Ambulatory Visit (HOSPITAL_COMMUNITY): Payer: Self-pay | Admitting: Anesthesiology

## 2023-06-10 ENCOUNTER — Other Ambulatory Visit: Payer: Self-pay

## 2023-06-10 ENCOUNTER — Ambulatory Visit (HOSPITAL_COMMUNITY)
Admission: RE | Admit: 2023-06-10 | Discharge: 2023-06-10 | Disposition: A | Discharge status: Home / Self Care | Payer: Medicaid Other | Attending: Oral Surgery | Admitting: Oral Surgery

## 2023-06-10 DIAGNOSIS — I10 Essential (primary) hypertension: Secondary | ICD-10-CM | POA: Diagnosis not present

## 2023-06-10 DIAGNOSIS — F17210 Nicotine dependence, cigarettes, uncomplicated: Secondary | ICD-10-CM | POA: Diagnosis not present

## 2023-06-10 DIAGNOSIS — K029 Dental caries, unspecified: Secondary | ICD-10-CM

## 2023-06-10 DIAGNOSIS — K0850 Unsatisfactory restoration of tooth, unspecified: Secondary | ICD-10-CM | POA: Diagnosis not present

## 2023-06-10 DIAGNOSIS — M899 Disorder of bone, unspecified: Secondary | ICD-10-CM | POA: Diagnosis not present

## 2023-06-10 DIAGNOSIS — E785 Hyperlipidemia, unspecified: Secondary | ICD-10-CM | POA: Diagnosis not present

## 2023-06-10 HISTORY — PX: TOOTH EXTRACTION: SHX859

## 2023-06-10 LAB — BASIC METABOLIC PANEL
Anion gap: 9 (ref 5–15)
BUN: 8 mg/dL (ref 6–20)
CO2: 26 mmol/L (ref 22–32)
Calcium: 9.6 mg/dL (ref 8.9–10.3)
Chloride: 104 mmol/L (ref 98–111)
Creatinine, Ser: 0.85 mg/dL (ref 0.44–1.00)
GFR, Estimated: >60 mL/min (ref >60)
Glucose, Bld: 126 mg/dL — ABNORMAL HIGH (ref 70–99)
Potassium: 4 mmol/L (ref 3.5–5.1)
Sodium: 139 mmol/L (ref 135–145)

## 2023-06-10 LAB — CBC
HCT: 42.4 % (ref 36.0–46.0)
Hemoglobin: 13.8 g/dL (ref 12.0–15.0)
MCH: 30.9 pg (ref 26.0–34.0)
MCHC: 32.5 g/dL (ref 30.0–36.0)
MCV: 94.9 fL (ref 80.0–100.0)
Platelets: 312 10*3/uL (ref 150–400)
RBC: 4.47 MIL/uL (ref 3.87–5.11)
RDW: 13 % (ref 11.5–15.5)
WBC: 9.5 10*3/uL (ref 4.0–10.5)
nRBC: 0 % (ref 0.0–0.2)

## 2023-06-10 SURGERY — DENTAL RESTORATION/EXTRACTIONS
Anesthesia: General

## 2023-06-10 MED ORDER — SUGAMMADEX SODIUM 200 MG/2ML IV SOLN
INTRAVENOUS | Status: DC | PRN
  Administered 2023-06-10: 150 mg via INTRAVENOUS
  Administered 2023-06-10: 50 mg via INTRAVENOUS

## 2023-06-10 MED ORDER — OXYCODONE-ACETAMINOPHEN 5-325 MG PO TABS: 1.0000 | ORAL_TABLET | Freq: Four times a day (QID) | ORAL | 0 refills | Status: AC | PRN

## 2023-06-10 MED ORDER — ROCURONIUM BROMIDE 10 MG/ML (PF) SYRINGE
PREFILLED_SYRINGE | INTRAVENOUS | Status: AC
  Filled 2023-06-10: qty 10

## 2023-06-10 MED ORDER — CHLORHEXIDINE GLUCONATE 0.12 % MT SOLN
15.0000 mL | Freq: Once | OROMUCOSAL | Status: AC
  Administered 2023-06-10: 15 mL via OROMUCOSAL
  Filled 2023-06-10: qty 15

## 2023-06-10 MED ORDER — PROPOFOL 10 MG/ML IV BOLUS
INTRAVENOUS | Status: AC
  Filled 2023-06-10: qty 20

## 2023-06-10 MED ORDER — ROCURONIUM BROMIDE 10 MG/ML (PF) SYRINGE
PREFILLED_SYRINGE | INTRAVENOUS | Status: DC | PRN
  Administered 2023-06-10: 50 mg via INTRAVENOUS

## 2023-06-10 MED ORDER — ACETAMINOPHEN 10 MG/ML IV SOLN: 1000.0000 mg | Freq: Once | INTRAVENOUS | Status: DC | PRN

## 2023-06-10 MED ORDER — AMOXICILLIN 500 MG PO CAPS: 500.0000 mg | ORAL_CAPSULE | Freq: Three times a day (TID) | ORAL | 0 refills | Status: DC

## 2023-06-10 MED ORDER — ONDANSETRON HCL 4 MG/2ML IJ SOLN
INTRAMUSCULAR | Status: AC
  Filled 2023-06-10: qty 2

## 2023-06-10 MED ORDER — ETOMIDATE 2 MG/ML IV SOLN
INTRAVENOUS | Status: AC
  Filled 2023-06-10: qty 10

## 2023-06-10 MED ORDER — OXYCODONE HCL 5 MG/5ML PO SOLN: 5.0000 mg | Freq: Once | ORAL | Status: DC | PRN

## 2023-06-10 MED ORDER — FENTANYL CITRATE (PF) 250 MCG/5ML IJ SOLN
INTRAMUSCULAR | Status: DC | PRN
Start: 2023-06-10 — End: 2023-06-10
  Administered 2023-06-10: 50 ug via INTRAVENOUS
  Administered 2023-06-10: 100 ug via INTRAVENOUS

## 2023-06-10 MED ORDER — LIDOCAINE-EPINEPHRINE 2 %-1:100000 IJ SOLN
INTRAMUSCULAR | Status: AC
  Filled 2023-06-10: qty 1

## 2023-06-10 MED ORDER — PHENYLEPHRINE 80 MCG/ML (10ML) SYRINGE FOR IV PUSH (FOR BLOOD PRESSURE SUPPORT)
PREFILLED_SYRINGE | INTRAVENOUS | Status: AC
  Filled 2023-06-10: qty 10

## 2023-06-10 MED ORDER — LACTATED RINGERS IV SOLN: INTRAVENOUS | Status: DC | PRN

## 2023-06-10 MED ORDER — DEXAMETHASONE SODIUM PHOSPHATE 10 MG/ML IJ SOLN
INTRAMUSCULAR | Status: DC | PRN
  Administered 2023-06-10: 10 mg via INTRAVENOUS

## 2023-06-10 MED ORDER — METOPROLOL TARTRATE 50 MG PO TABS
50.0000 mg | ORAL_TABLET | Freq: Once | ORAL | Status: AC
  Administered 2023-06-10: 50 mg via ORAL

## 2023-06-10 MED ORDER — PROPOFOL 10 MG/ML IV BOLUS
INTRAVENOUS | Status: DC | PRN
  Administered 2023-06-10: 150 mg via INTRAVENOUS
  Administered 2023-06-10: 50 mg via INTRAVENOUS

## 2023-06-10 MED ORDER — ONDANSETRON HCL 4 MG/2ML IJ SOLN
INTRAMUSCULAR | Status: DC | PRN
  Administered 2023-06-10: 4 mg via INTRAVENOUS

## 2023-06-10 MED ORDER — VASOPRESSIN 20 UNIT/ML IV SOLN
INTRAVENOUS | Status: AC
  Filled 2023-06-10: qty 1

## 2023-06-10 MED ORDER — OXYMETAZOLINE HCL 0.05 % NA SOLN
NASAL | Status: AC
  Filled 2023-06-10: qty 30

## 2023-06-10 MED ORDER — DEXAMETHASONE SODIUM PHOSPHATE 10 MG/ML IJ SOLN
INTRAMUSCULAR | Status: AC
  Filled 2023-06-10: qty 1

## 2023-06-10 MED ORDER — MIDAZOLAM HCL 2 MG/2ML IJ SOLN
INTRAMUSCULAR | Status: DC | PRN
Start: 2023-06-10 — End: 2023-06-10
  Administered 2023-06-10: 2 mg via INTRAVENOUS

## 2023-06-10 MED ORDER — LIDOCAINE 2% (20 MG/ML) 5 ML SYRINGE
INTRAMUSCULAR | Status: AC
  Filled 2023-06-10: qty 5

## 2023-06-10 MED ORDER — OXYCODONE HCL 5 MG PO TABS: 5.0000 mg | ORAL_TABLET | Freq: Once | ORAL | Status: DC | PRN

## 2023-06-10 MED ORDER — HYDROMORPHONE HCL 1 MG/ML IJ SOLN: 0.2500 mg | INTRAMUSCULAR | Status: DC | PRN

## 2023-06-10 MED ORDER — ONDANSETRON HCL 4 MG/2ML IJ SOLN: 4.0000 mg | Freq: Once | INTRAMUSCULAR | Status: DC | PRN

## 2023-06-10 MED ORDER — LIDOCAINE-EPINEPHRINE 2 %-1:100000 IJ SOLN
INTRAMUSCULAR | Status: DC | PRN
  Administered 2023-06-10: 15 mL

## 2023-06-10 MED ORDER — MIDAZOLAM HCL 2 MG/2ML IJ SOLN
INTRAMUSCULAR | Status: AC
  Filled 2023-06-10: qty 2

## 2023-06-10 MED ORDER — SODIUM CHLORIDE 0.9 % IR SOLN
Status: DC | PRN
  Administered 2023-06-10: 1000 mL

## 2023-06-10 MED ORDER — LIDOCAINE 2% (20 MG/ML) 5 ML SYRINGE
INTRAMUSCULAR | Status: AC
Start: 2023-06-10 — End: ?
  Filled 2023-06-10: qty 5

## 2023-06-10 MED ORDER — METOPROLOL TARTRATE 50 MG PO TABS
ORAL_TABLET | ORAL | Status: AC
  Filled 2023-06-10: qty 1

## 2023-06-10 MED ORDER — LIDOCAINE 2% (20 MG/ML) 5 ML SYRINGE
INTRAMUSCULAR | Status: DC | PRN
  Administered 2023-06-10: 100 mg via INTRAVENOUS

## 2023-06-10 MED ORDER — FENTANYL CITRATE (PF) 250 MCG/5ML IJ SOLN
INTRAMUSCULAR | Status: AC
Start: 2023-06-10 — End: ?
  Filled 2023-06-10: qty 5

## 2023-06-10 MED ORDER — FENTANYL CITRATE (PF) 250 MCG/5ML IJ SOLN
INTRAMUSCULAR | Status: AC
  Filled 2023-06-10: qty 5

## 2023-06-10 MED ORDER — ORAL CARE MOUTH RINSE: 15.0000 mL | Freq: Once | OROMUCOSAL | Status: AC

## 2023-06-10 MED ORDER — 0.9 % SODIUM CHLORIDE (POUR BTL) OPTIME
TOPICAL | Status: DC | PRN
  Administered 2023-06-10: 1000 mL

## 2023-06-10 MED ORDER — OXYMETAZOLINE HCL 0.05 % NA SOLN
NASAL | Status: DC | PRN
  Administered 2023-06-10: 2 via NASAL

## 2023-06-10 MED ORDER — AMISULPRIDE (ANTIEMETIC) 5 MG/2ML IV SOLN: 10.0000 mg | Freq: Once | INTRAVENOUS | Status: DC | PRN

## 2023-06-10 MED ORDER — CEFAZOLIN SODIUM-DEXTROSE 2-4 GM/100ML-% IV SOLN
2.0000 g | INTRAVENOUS | Status: AC
  Administered 2023-06-10: 2 g via INTRAVENOUS
  Filled 2023-06-10: qty 100

## 2023-06-10 SURGICAL SUPPLY — 28 items
BAG COUNTER SPONGE SURGICOUNT (BAG) IMPLANT
BLADE SURG 15 STRL LF DISP TIS (BLADE) ×1 IMPLANT
BUR CROSS CUT FISSURE 1.6 (BURR) ×1 IMPLANT
BUR EGG ELITE 4.0 (BURR) ×1 IMPLANT
CANISTER SUCT 3000ML PPV (MISCELLANEOUS) ×1 IMPLANT
COVER SURGICAL LIGHT HANDLE (MISCELLANEOUS) ×1 IMPLANT
GAUZE PACKING FOLDED 2 STR (GAUZE/BANDAGES/DRESSINGS) ×1 IMPLANT
GLOVE BIO SURGEON STRL SZ8 (GLOVE) ×1 IMPLANT
GOWN STRL REUS W/ TWL LRG LVL3 (GOWN DISPOSABLE) ×1 IMPLANT
GOWN STRL REUS W/ TWL XL LVL3 (GOWN DISPOSABLE) ×1 IMPLANT
IV NS 1000ML BAXH (IV SOLUTION) ×1 IMPLANT
KIT BASIN OR (CUSTOM PROCEDURE TRAY) ×1 IMPLANT
KIT TURNOVER KIT B (KITS) ×1 IMPLANT
NDL HYPO 25GX1X1/2 BEV (NEEDLE) ×2 IMPLANT
NEEDLE HYPO 25GX1X1/2 BEV (NEEDLE) ×2 IMPLANT
NS IRRIG 1000ML POUR BTL (IV SOLUTION) ×1 IMPLANT
PAD ARMBOARD 7.5X6 YLW CONV (MISCELLANEOUS) ×1 IMPLANT
SLEEVE IRRIGATION ELITE 7 (MISCELLANEOUS) ×1 IMPLANT
SPIKE FLUID TRANSFER (MISCELLANEOUS) ×1 IMPLANT
SPONGE SURGIFOAM ABS GEL 12-7 (HEMOSTASIS) IMPLANT
SUT MNCRL 4-0 27 PS-2 XMFL (SUTURE) ×2 IMPLANT
SUT PLAIN 3 0 PS2 27 (SUTURE) ×1 IMPLANT
SUTURE MNCRL 4-0 27XMF (SUTURE) IMPLANT
SYR BULB IRRIG 60ML STRL (SYRINGE) ×1 IMPLANT
SYR CONTROL 10ML LL (SYRINGE) ×1 IMPLANT
TRAY ENT MC OR (CUSTOM PROCEDURE TRAY) ×1 IMPLANT
TUBING IRRIGATION (MISCELLANEOUS) ×1 IMPLANT
YANKAUER SUCT BULB TIP NO VENT (SUCTIONS) ×1 IMPLANT

## 2023-06-10 NOTE — Transfer of Care (Signed)
Immediate Anesthesia Transfer of Care Note  Patient: Michele George  Procedure(s) Performed: DENTAL RESTORATION/EXTRACTIONS  Patient Location: PACU  Anesthesia Type:General  Level of Consciousness: drowsy and patient cooperative  Airway & Oxygen Therapy: Patient Spontanous Breathing and Patient connected to face mask oxygen  Post-op Assessment: Report given to RN and Post -op Vital signs reviewed and stable  Post vital signs: Reviewed and stable  Last Vitals:  Vitals Value Taken Time  BP 175/112 06/10/23 0850  Temp    Pulse 78 06/10/23 0854  Resp 15 06/10/23 0854  SpO2 91 % 06/10/23 0854  Vitals shown include unfiled device data.  Last Pain:  Vitals:   06/10/23 0623  PainSc: 0-No pain         Complications: No notable events documented.

## 2023-06-10 NOTE — Anesthesia Procedure Notes (Signed)
Procedure Name: Intubation Date/Time: 06/10/2023 7:31 AM  Performed by: Ammie Dalton, CRNAPre-anesthesia Checklist: Patient identified, Emergency Drugs available, Suction available and Patient being monitored Patient Re-evaluated:Patient Re-evaluated prior to induction Oxygen Delivery Method: Circle System Utilized Preoxygenation: Pre-oxygenation with 100% oxygen Induction Type: IV induction Ventilation: Mask ventilation without difficulty Laryngoscope Size: Mac and 3 Grade View: Grade I Nasal Tubes: Nasal Rae, Nasal prep performed and Magill forceps- large, utilized Tube size: 6.5 mm Number of attempts: 2 Placement Confirmation: ETT inserted through vocal cords under direct vision, positive ETCO2 and breath sounds checked- equal and bilateral Secured at: 25 cm Tube secured with: Tape Dental Injury: Teeth and Oropharynx as per pre-operative assessment  Comments: Cuff punctured with forceps on first attempt

## 2023-06-10 NOTE — Op Note (Signed)
06/10/2023  8:33 AM  PATIENT:  Michele George  63 y.o. female  PRE-OPERATIVE DIAGNOSIS:  NONRESTORABLE TEETH # 5, 11, 12, 18, 19, 22, 27, 28, MAXILLARY LEFT BUCCAL EXOSTOSIS  POST-OPERATIVE DIAGNOSIS:  SAME  PROCEDURE:  Procedure(s): EXTRACTION TEETH # 5, 11, 12, 18, 19, 22, 27, 28, ALVEOLOPLASTY, REMOVAL  MAXILLARY LEFT BUCCAL EXTOSIS  SURGEON:  Surgeon(s): Ocie Doyne, DMD  ANESTHESIA:   local and general  EBL:  minimal  DRAINS: none   SPECIMEN:  No Specimen  COUNTS:  YES  PLAN OF CARE: Discharge to home after PACU  PATIENT DISPOSITION:  PACU - hemodynamically stable.   PROCEDURE DETAILS: Dictation #[id number]  Michele Lopes, DMD 06/10/2023 8:33 AM

## 2023-06-10 NOTE — Anesthesia Postprocedure Evaluation (Signed)
Anesthesia Post Note  Patient: Michele George  Procedure(s) Performed: DENTAL RESTORATION/EXTRACTIONS     Patient location during evaluation: PACU Anesthesia Type: General Level of consciousness: awake and alert Pain management: pain level controlled Vital Signs Assessment: post-procedure vital signs reviewed and stable Respiratory status: spontaneous breathing, nonlabored ventilation, respiratory function stable and patient connected to nasal cannula oxygen Cardiovascular status: blood pressure returned to baseline and stable Postop Assessment: no apparent nausea or vomiting Anesthetic complications: no  No notable events documented.  Last Vitals:  Vitals:   06/10/23 0930 06/10/23 0945  BP: (!) 162/110 (!) 159/107  Pulse: 72 73  Resp: 18 (!) 9  Temp:  36.5 C  SpO2: 97% 96%    Last Pain:  Vitals:   06/10/23 0945  PainSc: 0-No pain                 Trevor Iha

## 2023-06-10 NOTE — Op Note (Signed)
NAME: CHARO, PHILIPP. MEDICAL RECORD NO: [id number] ACCOUNT NO: [id number] DATE OF BIRTH: [birth year] FACILITY: [facility] LOCATION: [facility] PHYSICIAN: Georgia Lopes, DDS  Operative Report   DATE OF PROCEDURE: 06/10/2023  PREOPERATIVE DIAGNOSES: 1.  Nonrestorable teeth numbers 5, 11, 12, 18, 19, 22, 27, and 28, secondary to dental caries. 2.  Maxillary left buccal exostosis.  POSTOPERATIVE DIAGNOSES: 1.  Nonrestorable teeth numbers 5, 11, 12, 18, 19, 22, 27, and 28, secondary to dental caries. 2.  Maxillary left buccal exostosis.  PROCEDURE:  Extraction of teeth numbers 5, 11, 12, 18, 19, 22, 27, and 28, alveoloplasty, right and left maxilla and left mandible, removal of left maxillary buccal exostosis.  SURGEON:  Georgia Lopes, DDS  ANESTHESIA:  General nasal intubation, Dr. Richardson Landry, attending.  DESCRIPTION OF PROCEDURE:  The patient was taken to the operating room and placed on the table in the supine position. General anesthesia was administered and a nasal endotracheal tube was placed and secured. The eyes were protected. The patient was  draped for surgery. A timeout was performed. The posterior pharynx was suctioned and a throat pack was placed. 2% lidocaine 1:100,000 epinephrine was infiltrated in an inferior alveolar block on the right and left side and buccal and palatal infiltration  of the maxilla around the teeth to be removed as well as along the posterior aspect of the left maxilla where the exostosis was.  The left mandible was operated first. A #15 blade was used to make an incision around teeth numbers 18 and 19. The  periosteum was reflected from around the teeth.  The teeth were elevated with the dental elevator and removed with the dental forceps. The sockets were curetted, irrigated, and closed with 3-0 chromic. Then, in the maxilla, the 15 blade was used to make  an incision at the tuberosity carried forward to the roots of teeth #11 and #12 and then onto the midline.  The periosteum was reflected. The Stryker handpiece was used to remove bone around tooth #11, which was embedded in the bone.  Tooth #11 and #12  were then elevated with a 301 elevator and removed with the dental forceps. Then, the tissue was reflected to expose the exostosis. It was reduced by contouring using the egg burr followed by the bone file and then alveoplasty was performed in the area  of 11 and 12 as the bone was not in the same contour as the posterior area of the maxilla and the vertical excess was removed using the egg burr followed by the bone file and alveoplasty technique. Then, the area was irrigated and closed with 3-0  chromic. Then, a 15 blade was used to make an incision around tooth number 22. The periosteum was reflected. The tooth was elevated and removed from the mouth with the forceps. A 15 blade was used to make an incision to allow for a flap of the buccal  vestibule where the canine prominence was excessive and would lead to undercuts. Alveoplasty was then performed using the egg bur followed by the bone file in this area. Then, the area was irrigated and closed with 4- 0 gut suture.  Then, attention was  turned to the right maxilla. The 15 blade was used to make an incision around tooth number 15 and around teeth numbers 27 and 28.  The periosteum was reflected.  The teeth were elevated and removed with the dental forceps.  Teeth number 27 and 28 was  debrided in the socket and then sutured  with 4-0 gut. Then, bone was removed around tooth number 5 using the egg burr and the bone file to smooth the prominent canine eminence to reduce undercuts.  Then, the area was irrigated and closed with 3-0 chromic  and the oral cavity was irrigated and suctioned.  The throat pack was removed. The patient was left under the care of anesthesia for extubation and transported to recovery with plans for discharge home through day surgery.   ESTIMATED BLOOD LOSS:  Minimum.  COMPLICATIONS:   None.  SPECIMENS:  None.  COUNTS: Correct.     PAA D: 06/10/2023 8:39:21 am T: 06/10/2023 10:13:00 am  JOB: [id number]/ [id number]

## 2023-06-10 NOTE — H&P (Signed)
H&P documentation  -History and Physical Reviewed  -Patient has been re-examined  -No change in the plan of care  Michele George

## 2023-06-11 ENCOUNTER — Encounter (HOSPITAL_COMMUNITY): Payer: Self-pay | Admitting: Oral Surgery

## 2023-09-06 ENCOUNTER — Other Ambulatory Visit: Payer: 59

## 2023-09-06 ENCOUNTER — Other Ambulatory Visit

## 2023-09-06 DIAGNOSIS — E785 Hyperlipidemia, unspecified: Secondary | ICD-10-CM | POA: Diagnosis not present

## 2023-09-06 DIAGNOSIS — I10 Essential (primary) hypertension: Secondary | ICD-10-CM

## 2023-09-09 ENCOUNTER — Encounter: Payer: Self-pay | Admitting: Family

## 2023-09-09 ENCOUNTER — Ambulatory Visit: Payer: 59 | Admitting: Family

## 2023-09-09 ENCOUNTER — Ambulatory Visit: Payer: Self-pay | Admitting: Family

## 2023-09-09 VITALS — BP 138/88 | HR 116 | Temp 97.7°F | Resp 19 | Ht 65.0 in | Wt 182.0 lb

## 2023-09-09 DIAGNOSIS — I10 Essential (primary) hypertension: Secondary | ICD-10-CM | POA: Diagnosis not present

## 2023-09-09 DIAGNOSIS — E785 Hyperlipidemia, unspecified: Secondary | ICD-10-CM

## 2023-09-09 DIAGNOSIS — R7303 Prediabetes: Secondary | ICD-10-CM | POA: Diagnosis not present

## 2023-09-09 DIAGNOSIS — F17209 Nicotine dependence, unspecified, with unspecified nicotine-induced disorders: Secondary | ICD-10-CM

## 2023-09-09 MED ORDER — ATORVASTATIN CALCIUM 20 MG PO TABS: 20.0000 mg | ORAL_TABLET | Freq: Every day | ORAL | 3 refills | Status: DC

## 2023-09-09 NOTE — Progress Notes (Signed)
 Provider: Christean Courts FNP-C   Dontrey Snellgrove, Elijio Guadeloupe, NP  Patient Care Team: Gabrianna Fassnacht, Elijio Guadeloupe, NP as PCP - General (Family Medicine)  Extended Emergency Contact Information Primary Emergency Contact: FOUST,PATRICIA Address: [address] [place], [place] [address] [place] Home Phone: [phone] Mobile Phone: [phone] Relation: Mother Secondary Emergency Contact: [facility] Mobile Phone: [phone] Relation: Granddaughter  Code Status:  Full Code  Goals of care: Advanced Directive information    09/09/2023   10:48 AM  Advanced Directives  Does Patient Have a Medical Advance Directive? No  Would patient like information on creating a medical advance directive? No - Patient declined     Chief Complaint  Patient presents with   Medical Management of Chronic Issues    6 month follow up.     Discussed the use of AI scribe software for clinical note transcription with the patient, who gave verbal consent to proceed.  History of Present Illness   Michele George is a 63 year old female with hypertension and hyperlipidemia who presents for a six month follow-up.  She has recently undergone full dental extractions and is in the process of adjusting to dentures, which she removes while eating. She is trying to get accustomed to them.  She has successfully quit smoking and is no longer using nicotine  patches. She is not currently engaging in regular exercise.  Her current medications include amlodipine  10 mg daily for hypertension, metoprolol  50 mg twice daily, and atorvastatin , though she is unsure of the dose. She takes Tylenol  infrequently.  Recent lab work showed a total cholesterol of 196 mg/dL, triglycerides of 962 mg/dL, and LDL of 952 mg/dL. Follow-up labs indicate triglycerides have improved to 107 mg/dL, but LDL remains elevated at 117 mg/dL. Her blood sugar was slightly elevated at 119 mg/dL during her last lab work.  No issues with  allergies, numbness, or tingling in her legs. She describes her sleep as 'so-so' and denies any pain or tenderness in her ears, nose, or abdomen.   Past Medical History:  Diagnosis Date   Hypertension    Stroke [facility]) 2024   Past Surgical History:  Procedure Laterality Date   BREAST BIOPSY Right 04/01/2023   US  RT BREAST BX W LOC DEV 1ST LESION IMG BX SPEC US  GUIDE 04/01/2023 [facility] MAMMOGRAPHY   COLONOSCOPY     TOOTH EXTRACTION N/A 06/10/2023   Procedure: DENTAL RESTORATION/EXTRACTIONS;  Surgeon: Ascencion Lava, DMD;  Location: [facility] OR;  Service: Oral Surgery;  Laterality: N/A;   TOTAL ABDOMINAL HYSTERECTOMY  2012    No Known Allergies  Allergies as of 09/09/2023   No Known Allergies      Medication List        Accurate as of Sep 09, 2023  4:34 PM. If you have any questions, ask your nurse or doctor.          STOP taking these medications    Nicotine  21-14-7 MG/24HR Kit Stopped by: Elijio Guadeloupe [name]       TAKE these medications    acetaminophen  500 MG tablet Commonly known as: TYLENOL  Take 1 tablet (500 mg total) by mouth every 6 (six) hours as needed. What changed: reasons to take this   amLODipine  10 MG tablet Commonly known as: NORVASC  Take 10 mg by mouth daily. What changed: Another medication with the same name was removed. Continue taking this medication, and follow the directions you see here. Changed by: [name]  [name]   amoxicillin  500 MG capsule Commonly known as: AMOXIL  Take 1 capsule (500 mg total) by mouth 3 (three) times daily.   atorvastatin  20 MG tablet Commonly known as: LIPITOR Take 1 tablet (20 mg total) by mouth daily. What changed: how much to take   metoprolol  tartrate 50 MG tablet Commonly known as: LOPRESSOR  Take 1 tablet (50 mg total) by mouth 2 (two) times daily.        Review of Systems  Constitutional:  Negative for appetite change, chills, fatigue, fever and unexpected weight change.  HENT:  Negative for congestion,  dental problem, ear discharge, ear pain, facial swelling, hearing loss, nosebleeds, postnasal drip, rhinorrhea, sinus pressure, sinus pain, sneezing, sore throat, tinnitus and trouble swallowing.   Eyes:  Negative for pain, discharge, redness, itching and visual disturbance.  Respiratory:  Negative for cough, chest tightness, shortness of breath and wheezing.   Cardiovascular:  Negative for chest pain, palpitations and leg swelling.  Gastrointestinal:  Negative for abdominal distention, abdominal pain, blood in stool, constipation, diarrhea, nausea and vomiting.  Endocrine: Negative for cold intolerance, heat intolerance, polydipsia, polyphagia and polyuria.  Genitourinary:  Negative for difficulty urinating, dysuria, flank pain, frequency and urgency.  Musculoskeletal:  Negative for arthralgias, back pain, gait problem, joint swelling, myalgias, neck pain and neck stiffness.  Skin:  Negative for color change, pallor, rash and wound.  Neurological:  Negative for dizziness, syncope, speech difficulty, weakness, light-headedness, numbness and headaches.  Hematological:  Does not bruise/bleed easily.  Psychiatric/Behavioral:  Negative for agitation, behavioral problems, confusion, hallucinations, self-injury, sleep disturbance and suicidal ideas. The patient is not nervous/anxious.     Immunization History  Administered Date(s) Administered   DTaP 05/28/1965, 07/09/1965, 08/13/1965, 09/09/1966   IPV 05/28/1965, 07/09/1965, 08/13/1965, 08/09/1978   Influenza, Seasonal, Injecte, Preservative Fre 03/01/2023   MODERNA SARS COV-2 Pediatric Vaccination 6mos to <59yrs 08/01/2019, 08/29/2019   Measles 07/02/1965   Moderna Sars-Covid-2 Vaccination 08/01/2019, 08/29/2019   PFIZER(Purple Top)SARS-COV-2 Vaccination 06/16/2021   Rubella 05/28/1968   Smallpox 07/09/1965   Td 11/07/2020   Tdap 11/07/2020   Zoster Recombinant(Shingrix) 07/25/2021, 10/01/2021   Pertinent  Health Maintenance Due  Topic Date  Due   INFLUENZA VACCINE  11/25/2023   MAMMOGRAM  03/30/2025   Colonoscopy  09/19/2031      09/30/2021    9:44 AM 03/31/2022    8:29 AM 11/10/2022    1:13 PM 03/01/2023   12:46 PM 09/09/2023   10:47 AM  Fall Risk  Falls in the past year? 0 0 0 1 0  Was there an injury with Fall? 0 0 0 0 0  Fall Risk Category Calculator 0 0 0 2 0  Fall Risk Category (Retired) Low Low     (RETIRED) Patient Fall Risk Level Low fall risk Low fall risk     Patient at Risk for Falls Due to No Fall Risks No Fall Risks No Fall Risks History of fall(s) No Fall Risks  Fall risk Follow up Falls evaluation completed Falls evaluation completed Falls evaluation completed  Falls evaluation completed   Functional Status Survey:    Vitals:   09/09/23 1052  BP: 138/88  Pulse: (!) 116  Resp: 19  Temp: 97.7 F (36.5 C)  SpO2: 97%  Weight: 182 lb (82.6 kg)  Height: 5\' 5"  (1.651 m)   Body mass index is 30.29 kg/m. Physical Exam VITALS: T- 97.7, P- 73, BP- 138/88, SaO2- 97% MEASUREMENTS: Weight- 182. GENERAL: Alert, cooperative, well developed, no acute distress. HEENT: Normocephalic,  normal oropharynx, moist mucous membranes, ears normal with cerumen present, nose normal, oral cavity normal with dentures present. NECK: Supple, no tenderness. CHEST: Clear to auscultation bilaterally, no wheezes, rhonchi, or crackles. CARDIOVASCULAR: Normal heart rate and rhythm, S1 and S2 normal without murmurs. ABDOMEN: Soft, non-tender, non-distended, without organomegaly, normal bowel sounds. EXTREMITIES: No cyanosis or edema. MUSCULOSKELETAL: Normal range of motion in legs. NEUROLOGICAL: Cranial nerves grossly intact, moves all extremities without gross motor or sensory deficit.  SKIN: No rash,no lesion or erythema   PSYCHIATRY/BEHAVIORAL: Mood stable    Labs reviewed: Recent Labs    05/06/23 0632 06/10/23 0615 09/06/23 0930  NA 138 139 140  K 4.2 4.0 4.3  CL 106 104 105  CO2 20* 26 28  GLUCOSE 108* 126* 119*   BUN 7 8 9   CREATININE 0.77 0.85 0.84  CALCIUM  9.1 9.6 9.6   Recent Labs    11/09/22 1317 03/01/23 1347 09/06/23 0930  AST 16 11 13  13   ALT 17 13 14  14   ALKPHOS 100  --   --   BILITOT 0.6 0.3 0.4  0.4  PROT 6.8 6.9 6.6  6.6  ALBUMIN 3.4*  --   --    Recent Labs    11/09/22 1317 11/09/22 1324 03/01/23 1347 05/06/23 0632 06/10/23 0615 09/06/23 0930  WBC 7.8  --  6.5 7.7 9.5 6.5  NEUTROABS 5.0  --  3,679  --   --  3,341  HGB 14.2   < > 14.3 14.0 13.8 14.4  HCT 42.7   < > 44.6 43.1 42.4 44.1  MCV 94.3  --  93.5 93.9 94.9 92.8  PLT 312  --  368 276 312 311   < > = values in this interval not displayed.   Lab Results  Component Value Date   TSH 1.82 09/06/2023   Lab Results  Component Value Date   HGBA1C 5.8 (H) 03/01/2023   Lab Results  Component Value Date   CHOL 182 09/06/2023   HDL 44 (L) 09/06/2023   LDLCALC 117 (H) 09/06/2023   TRIG 107 09/06/2023   CHOLHDL 4.1 09/06/2023    Significant Diagnostic Results in last 30 days:  No results found.  Assessment/Plan    Hypertension Blood pressure is 138/88 mmHg, indicating controlled hypertension. She is adherent to amlodipine  10 mg daily and metoprolol  50 mg twice daily. - Continue amlodipine  10 mg daily - Continue metoprolol  50 mg twice daily  Hyperlipidemia Total cholesterol is 196 mg/dL, triglycerides 53 mg/dL, LDL 696 mg/dL, and HDL 44 mg/dL. Elevated LDL necessitates an increase in atorvastatin  dosage. Advised dietary modifications to reduce fatty and processed foods and increase vegetable consumption. - Increase atorvastatin  to 20 mg daily - Send prescription for atorvastatin  to [facility] - Order follow-up blood work to recheck lipid levels Prediabetes Blood glucose level is 119 mg/dL, indicating elevation. Previous A1C 5.8 Monitoring for potential diabetes is necessary. Advised increased physical activity to manage blood glucose levels. - Order follow-up blood work to  check for diabetes  Tobacco use She has quit smoking and discontinued nicotine  patches. Encouraged to maintain cessation. - Encourage continued smoking cessation   Family/ staff Communication: Reviewed plan of care with patient verbalized understanding  Labs/tests ordered:  - CBC with Differential/Platelet - CMP with eGFR([facility]) - TSH - Hgb A1C - Lipid panel   Next Appointment : Return in about 6 months (around 03/11/2024) for medical mangement of chronic issues., Fasting labs in 6 months prior to visit.  Spent 30 minutes of Face to face and non-face to face with patient  >50% time spent counseling; reviewing medical record; tests; labs; documentation and developing future plan of care.   Estil Heman, NP

## 2023-09-10 LAB — COMPLETE METABOLIC PANEL WITHOUT GFR
AG Ratio: 1.4 (calc) (ref 1.0–2.5)
ALT: 14 U/L (ref 6–29)
AST: 13 U/L (ref 10–35)
Albumin: 3.9 g/dL (ref 3.6–5.1)
Alkaline phosphatase (APISO): 104 U/L (ref 37–153)
BUN: 9 mg/dL (ref 7–25)
CO2: 28 mmol/L (ref 20–32)
Calcium: 9.6 mg/dL (ref 8.6–10.4)
Chloride: 105 mmol/L (ref 98–110)
Creat: 0.84 mg/dL (ref 0.50–1.05)
Globulin: 2.7 g/dL (ref 1.9–3.7)
Glucose, Bld: 119 mg/dL — ABNORMAL HIGH (ref 65–99)
Potassium: 4.3 mmol/L (ref 3.5–5.3)
Sodium: 140 mmol/L (ref 135–146)
Total Bilirubin: 0.4 mg/dL (ref 0.2–1.2)
Total Protein: 6.6 g/dL (ref 6.1–8.1)

## 2023-09-10 LAB — LIPID PANEL
Cholesterol: 182 mg/dL (ref <200)
HDL: 44 mg/dL — ABNORMAL LOW (ref >=50)
LDL Cholesterol (Calc): 117 mg/dL — ABNORMAL HIGH
Non-HDL Cholesterol (Calc): 138 mg/dL — ABNORMAL HIGH (ref <130)
Total CHOL/HDL Ratio: 4.1 (calc) (ref <5.0)
Triglycerides: 107 mg/dL (ref <150)

## 2023-09-10 LAB — HEPATIC FUNCTION PANEL
AG Ratio: 1.4 (calc) (ref 1.0–2.5)
ALT: 14 U/L (ref 6–29)
AST: 13 U/L (ref 10–35)
Albumin: 3.9 g/dL (ref 3.6–5.1)
Alkaline phosphatase (APISO): 104 U/L (ref 37–153)
Bilirubin, Direct: 0.1 mg/dL (ref 0.0–0.2)
Globulin: 2.7 g/dL (ref 1.9–3.7)
Indirect Bilirubin: 0.3 mg/dL (ref 0.2–1.2)
Total Bilirubin: 0.4 mg/dL (ref 0.2–1.2)
Total Protein: 6.6 g/dL (ref 6.1–8.1)

## 2023-09-10 LAB — CBC WITH DIFFERENTIAL/PLATELET
Absolute Lymphocytes: 2516 {cells}/uL (ref 850–3900)
Absolute Monocytes: 462 {cells}/uL (ref 200–950)
Basophils Absolute: 91 {cells}/uL (ref 0–200)
Basophils Relative: 1.4 %
Eosinophils Absolute: 91 {cells}/uL (ref 15–500)
Eosinophils Relative: 1.4 %
HCT: 44.1 % (ref 35.0–45.0)
Hemoglobin: 14.4 g/dL (ref 11.7–15.5)
MCH: 30.3 pg (ref 27.0–33.0)
MCHC: 32.7 g/dL (ref 32.0–36.0)
MCV: 92.8 fL (ref 80.0–100.0)
MPV: 10.7 fL (ref 7.5–12.5)
Monocytes Relative: 7.1 %
Neutro Abs: 3341 {cells}/uL (ref 1500–7800)
Neutrophils Relative %: 51.4 %
Platelets: 311 10*3/uL (ref 140–400)
RBC: 4.75 10*6/uL (ref 3.80–5.10)
RDW: 11.6 % (ref 11.0–15.0)
Total Lymphocyte: 38.7 %
WBC: 6.5 10*3/uL (ref 3.8–10.8)

## 2023-09-10 LAB — TEST AUTHORIZATION

## 2023-09-10 LAB — TSH: TSH: 1.82 m[IU]/L (ref 0.40–4.50)

## 2023-09-10 LAB — HEMOGLOBIN A1C
Hgb A1c MFr Bld: 6 % — ABNORMAL HIGH (ref <5.7)
Mean Plasma Glucose: 126 mg/dL
eAG (mmol/L): 7 mmol/L

## 2023-09-20 DIAGNOSIS — H25813 Combined forms of age-related cataract, bilateral: Secondary | ICD-10-CM | POA: Diagnosis not present

## 2023-09-20 DIAGNOSIS — I639 Cerebral infarction, unspecified: Secondary | ICD-10-CM | POA: Diagnosis not present

## 2023-09-28 DIAGNOSIS — I639 Cerebral infarction, unspecified: Secondary | ICD-10-CM | POA: Diagnosis not present

## 2023-09-28 DIAGNOSIS — H25813 Combined forms of age-related cataract, bilateral: Secondary | ICD-10-CM | POA: Diagnosis not present

## 2023-09-28 DIAGNOSIS — H5213 Myopia, bilateral: Secondary | ICD-10-CM | POA: Diagnosis not present

## 2023-12-14 DIAGNOSIS — H524 Presbyopia: Secondary | ICD-10-CM | POA: Diagnosis not present

## 2024-02-17 ENCOUNTER — Other Ambulatory Visit: Payer: Self-pay | Admitting: Family

## 2024-02-17 DIAGNOSIS — Z1231 Encounter for screening mammogram for malignant neoplasm of breast: Secondary | ICD-10-CM

## 2024-03-08 ENCOUNTER — Other Ambulatory Visit: Payer: Self-pay

## 2024-03-08 DIAGNOSIS — I10 Essential (primary) hypertension: Secondary | ICD-10-CM

## 2024-03-08 DIAGNOSIS — E785 Hyperlipidemia, unspecified: Secondary | ICD-10-CM

## 2024-03-08 DIAGNOSIS — R7303 Prediabetes: Secondary | ICD-10-CM

## 2024-03-12 ENCOUNTER — Encounter: Payer: Self-pay | Admitting: Family

## 2024-03-12 ENCOUNTER — Ambulatory Visit: Payer: Self-pay | Admitting: Family

## 2024-03-12 ENCOUNTER — Other Ambulatory Visit

## 2024-03-12 VITALS — BP 128/84 | HR 100 | Temp 98.1°F | Resp 20 | Ht 65.0 in | Wt 178.6 lb

## 2024-03-12 DIAGNOSIS — I10 Essential (primary) hypertension: Secondary | ICD-10-CM

## 2024-03-12 DIAGNOSIS — R7303 Prediabetes: Secondary | ICD-10-CM

## 2024-03-12 DIAGNOSIS — E785 Hyperlipidemia, unspecified: Secondary | ICD-10-CM

## 2024-03-12 DIAGNOSIS — R2681 Unsteadiness on feet: Secondary | ICD-10-CM

## 2024-03-12 DIAGNOSIS — F17209 Nicotine dependence, unspecified, with unspecified nicotine-induced disorders: Secondary | ICD-10-CM | POA: Diagnosis not present

## 2024-03-12 DIAGNOSIS — Z23 Encounter for immunization: Secondary | ICD-10-CM | POA: Diagnosis not present

## 2024-03-12 DIAGNOSIS — R29898 Other symptoms and signs involving the musculoskeletal system: Secondary | ICD-10-CM | POA: Diagnosis not present

## 2024-03-12 MED ORDER — BUPROPION HCL ER (SR) 150 MG PO TB12: 150.0000 mg | ORAL_TABLET | Freq: Every day | ORAL | 3 refills | Status: AC

## 2024-03-12 MED ORDER — ATORVASTATIN CALCIUM 20 MG PO TABS: 20.0000 mg | ORAL_TABLET | Freq: Every day | ORAL | 3 refills | Status: AC

## 2024-03-12 MED ORDER — AMLODIPINE BESYLATE 10 MG PO TABS: 10.0000 mg | ORAL_TABLET | Freq: Every day | ORAL | 1 refills | Status: AC

## 2024-03-12 NOTE — Progress Notes (Signed)
 Provider: Roxan Plough FNP-C   David Towson, Roxan BROCKS, NP  Patient Care Team: Galdino Hinchman, Roxan BROCKS, NP as PCP - General (Family Medicine)  Extended Emergency Contact Information Primary Emergency Contact: FOUST,PATRICIA Address: [address]          [place], [place] [address] [place] Home Phone: [phone] Mobile Phone: [phone] Relation: Mother Secondary Emergency Contact: [facility] Mobile Phone: [phone] Relation: Granddaughter  Code Status:  Full Code  Goals of care: Advanced Directive information    03/12/2024   10:27 AM  Advanced Directives  Does Patient Have a Medical Advance Directive? No  Would patient like information on creating a medical advance directive? No - Patient declined     Chief Complaint  Patient presents with   Medical Management of Chronic Issues    6 Month follow up.     Discussed the use of AI scribe software for clinical note transcription with the patient, who gave verbal consent to proceed.  History of Present Illness   Michele George is a 63 year old female who presents for a six-month follow-up visit.  She experiences muscle weakness in both legs, leading to falls, with her legs occasionally 'giving out.' There is no associated dizziness. She uses a cane for support but did not bring it to the appointment. She is not currently driving.  She notes some weakness in her hands, though to a lesser extent than in her legs. No home blood pressure readings for review. Denies any symptoms of hypotension.   She is currently taking amlodipine  10 mg daily and metoprolol  50 mg twice daily for hypertension. She has not taken her medications today. She also takes atorvastatin  20 mg for cholesterol management and Tylenol  500 mg every six hours for pain.  She smokes approximately six cigarettes per day and wants to quit smoking. She has not previously used any smoking cessation aids. Agrees to try Wellbutrin to quit smoking.     Past Medical History:  Diagnosis Date   Hypertension    Stroke [facility]) 2024   Past Surgical History:  Procedure Laterality Date   BREAST BIOPSY Right 04/01/2023   US  RT BREAST BX W LOC DEV 1ST LESION IMG BX SPEC US  GUIDE 04/01/2023 [facility] MAMMOGRAPHY   COLONOSCOPY     TOOTH EXTRACTION N/A 06/10/2023   Procedure: DENTAL RESTORATION/EXTRACTIONS;  Surgeon: Sheryle Hamilton, DMD;  Location: [facility] OR;  Service: Oral Surgery;  Laterality: N/A;   TOTAL ABDOMINAL HYSTERECTOMY  2012    No Known Allergies  Allergies as of 03/12/2024   No Known Allergies      Medication List        Accurate as of March 12, 2024 11:13 AM. If you have any questions, ask your nurse or doctor.          STOP taking these medications    amoxicillin  500 MG capsule Commonly known as: AMOXIL  Stopped by: Kendrik Mcshan C Yacine Droz       TAKE these medications    acetaminophen  500 MG tablet Commonly known as: TYLENOL  Take 1 tablet (500 mg total) by mouth every 6 (six) hours as needed. What changed:  when to take this reasons to take this   amLODipine  10 MG tablet Commonly known as: NORVASC  Take 1 tablet (10 mg total) by mouth daily.   atorvastatin  20 MG tablet Commonly known as: LIPITOR Take 1 tablet (20 mg total) by mouth daily.   buPROPion 150 MG 12 hr tablet Commonly known as: WELLBUTRIN SR Take 1  tablet (150 mg total) by mouth daily. Started by: Katelyn Kohlmeyer C Jennfer Gassen   metoprolol  tartrate 50 MG tablet Commonly known as: LOPRESSOR  Take 1 tablet (50 mg total) by mouth 2 (two) times daily.        Review of Systems  Constitutional:  Negative for appetite change, chills, fatigue, fever and unexpected weight change.  HENT:  Negative for congestion, dental problem, ear discharge, ear pain, facial swelling, hearing loss, nosebleeds, postnasal drip, rhinorrhea, sinus pressure, sinus pain, sneezing, sore throat, tinnitus and trouble swallowing.   Eyes:  Negative for pain, discharge, redness, itching and  visual disturbance.  Respiratory:  Negative for cough, chest tightness, shortness of breath and wheezing.   Cardiovascular:  Negative for chest pain, palpitations and leg swelling.  Gastrointestinal:  Negative for abdominal distention, abdominal pain, blood in stool, constipation, diarrhea, nausea and vomiting.  Endocrine: Negative for cold intolerance, heat intolerance, polydipsia, polyphagia and polyuria.  Genitourinary:  Negative for difficulty urinating, dysuria, flank pain, frequency and urgency.  Musculoskeletal:  Positive for gait problem. Negative for arthralgias, back pain, joint swelling, myalgias, neck pain and neck stiffness.  Skin:  Negative for color change, pallor, rash and wound.  Neurological:  Positive for weakness. Negative for dizziness, syncope, speech difficulty, light-headedness, numbness and headaches.  Hematological:  Does not bruise/bleed easily.  Psychiatric/Behavioral:  Negative for agitation, behavioral problems, confusion, hallucinations, self-injury, sleep disturbance and suicidal ideas. The patient is not nervous/anxious.     Immunization History  Administered Date(s) Administered   DTaP 05/28/1965, 07/09/1965, 08/13/1965, 09/09/1966   IPV 05/28/1965, 07/09/1965, 08/13/1965, 08/09/1978   Influenza, Seasonal, Injecte, Preservative Fre 03/01/2023, 03/12/2024   MODERNA SARS COV-2 Pediatric Vaccination 6mos to <70yrs 08/01/2019, 08/29/2019   Measles 07/02/1965   Moderna Sars-Covid-2 Vaccination 08/01/2019, 08/29/2019   PFIZER(Purple Top)SARS-COV-2 Vaccination 06/16/2021   PNEUMOCOCCAL CONJUGATE-20 03/12/2024   Rubella 05/28/1968   Smallpox 07/09/1965   Td 11/07/2020   Tdap 11/07/2020   Zoster Recombinant(Shingrix) 07/25/2021, 10/01/2021   Pertinent  Health Maintenance Due  Topic Date Due   Mammogram  03/30/2025   Colonoscopy  09/19/2031   Influenza Vaccine  Completed      03/31/2022    8:29 AM 11/10/2022    1:13 PM 03/01/2023   12:46 PM 09/09/2023    10:47 AM 03/12/2024   10:27 AM  Fall Risk  Falls in the past year? 0 0 1 0 1  Was there an injury with Fall? 0 0 0 0 0  Fall Risk Category Calculator 0 0 2 0 2  Fall Risk Category (Retired) Low       (RETIRED) Patient Fall Risk Level Low fall risk       Patient at Risk for Falls Due to No Fall Risks No Fall Risks History of fall(s) No Fall Risks No Fall Risks  Fall risk Follow up Falls evaluation completed  Falls evaluation completed  Falls evaluation completed Falls evaluation completed     Data saved with a previous flowsheet row definition   Functional Status Survey:    Vitals:   03/12/24 1030  BP: 128/84  Pulse: 100  Resp: 20  Temp: 98.1 F (36.7 C)  SpO2: 97%  Weight: 178 lb 9.6 oz (81 kg)  Height: 5' 5 (1.651 m)   Body mass index is 29.72 kg/m.  Physical Exam   VITALS: T- 98.1, P- 100, BP- 128/84, SaO2- 97% MEASUREMENTS: Weight- 178. GENERAL: Alert, cooperative, well developed, no acute distress. HEENT: Normocephalic, normal oropharynx, moist mucous membranes, ears normal bilaterally, nose normal.  NECK: Thyroid normal. CHEST: Clear to auscultation bilaterally, no wheezes, rhonchi, or crackles. CARDIOVASCULAR: Normal heart rate and rhythm, S1 and S2 normal without murmurs. ABDOMEN: Soft, non-tender, non-distended, without organomegaly, normal bowel sounds. EXTREMITIES: No cyanosis or edema, no swelling in knees, calf muscles non-tender. MUSCULOSKELETAL: No pain on leg raise, no pain on knee flexion. NEUROLOGICAL: Cranial nerves grossly intact, moves all extremities without gross motor or sensory deficit, reflexes normal, motor strength intact, sensation intact in legs.   Labs reviewed: Recent Labs    05/06/23 0632 06/10/23 0615 09/06/23 0930  NA 138 139 140  K 4.2 4.0 4.3  CL 106 104 105  CO2 20* 26 28  GLUCOSE 108* 126* 119*  BUN 7 8 9   CREATININE 0.77 0.85 0.84  CALCIUM  9.1 9.6 9.6   Recent Labs    09/06/23 0930  AST 13  13  ALT 14  14  BILITOT  0.4  0.4  PROT 6.6  6.6   Recent Labs    05/06/23 0632 06/10/23 0615 09/06/23 0930  WBC 7.7 9.5 6.5  NEUTROABS  --   --  3,341  HGB 14.0 13.8 14.4  HCT 43.1 42.4 44.1  MCV 93.9 94.9 92.8  PLT 276 312 311   Lab Results  Component Value Date   TSH 1.82 09/06/2023   Lab Results  Component Value Date   HGBA1C 6.0 (H) 09/06/2023   Lab Results  Component Value Date   CHOL 182 09/06/2023   HDL 44 (L) 09/06/2023   LDLCALC 117 (H) 09/06/2023   TRIG 107 09/06/2023   CHOLHDL 4.1 09/06/2023    Significant Diagnostic Results in last 30 days:  No results found.  Assessment/Plan  Unsteady gait and muscle weakness Bilateral leg muscle weakness with a couple of falls, no injuries. Mild hand weakness. No dizziness or palpitations. Physical examination shows good strength, but physical therapy is recommended to strengthen legs. - Referred to physical therapy for leg strengthening exercises - Advised use of cane for stability when needed  Essential hypertension Blood pressure is well-controlled at 128/84 mmHg. Heart rate elevated at 100 bpm, likely due to missed metoprolol  dose. - Instructed to take metoprolol  50 mg twice daily - Instructed to take amlodipine  10 mg daily  Hyperlipidemia Continues on atorvastatin  20 mg daily. Requires refill. - Sent atorvastatin  20 mg refill to pharmacy   Prediabetes continue with dietary modification and exercise advised.   Nicotine  dependence Smokes approximately six cigarettes per day. Interested in quitting smoking. Discussed options including Wellbutrin, nicotine  gum, and patch. Prefers Wellbutrin. - Prescribed Wellbutrin once daily for one month, then increase to twice daily if still smoking - Scheduled follow-up in one month to assess progress  Encounter for immunization Flu shot already administered. Mammogram scheduled for November 21st. COVID vaccine due and can be obtained at the pharmacy. - Ensure COVID vaccine is administered  at pharmacy          Family/ staff Communication: Reviewed plan of care with patient  Labs/tests ordered: Lab orders in place   Next Appointment : Return in about 6 months (around 09/09/2024) for medical mangement of chronic issues.SABRA   Spent 30 minutes of Face to face and non-face to face with patient  >50% time spent counseling; reviewing medical record; tests; labs; documentation and developing future plan of care.   Roxan JAYSON Plough, NP

## 2024-03-12 NOTE — Patient Instructions (Signed)
 1.Report to local pharmacy to receive Covid Vaccine.

## 2024-03-13 LAB — LIPID PANEL
Cholesterol: 222 mg/dL — ABNORMAL HIGH (ref <200)
HDL: 44 mg/dL — ABNORMAL LOW (ref >=50)
LDL Cholesterol (Calc): 152 mg/dL — ABNORMAL HIGH
Non-HDL Cholesterol (Calc): 178 mg/dL — ABNORMAL HIGH (ref <130)
Total CHOL/HDL Ratio: 5 (calc) — ABNORMAL HIGH (ref <5.0)
Triglycerides: 134 mg/dL (ref <150)

## 2024-03-13 LAB — COMPLETE METABOLIC PANEL WITHOUT GFR
AG Ratio: 1.6 (calc) (ref 1.0–2.5)
ALT: 17 U/L (ref 6–29)
AST: 14 U/L (ref 10–35)
Albumin: 4.1 g/dL (ref 3.6–5.1)
Alkaline phosphatase (APISO): 96 U/L (ref 37–153)
BUN: 9 mg/dL (ref 7–25)
CO2: 27 mmol/L (ref 20–32)
Calcium: 9.8 mg/dL (ref 8.6–10.4)
Chloride: 106 mmol/L (ref 98–110)
Creat: 0.77 mg/dL (ref 0.50–1.05)
Globulin: 2.5 g/dL (ref 1.9–3.7)
Glucose, Bld: 108 mg/dL — ABNORMAL HIGH (ref 65–99)
Potassium: 4.1 mmol/L (ref 3.5–5.3)
Sodium: 141 mmol/L (ref 135–146)
Total Bilirubin: 0.5 mg/dL (ref 0.2–1.2)
Total Protein: 6.6 g/dL (ref 6.1–8.1)

## 2024-03-13 LAB — CBC WITH DIFFERENTIAL/PLATELET
Absolute Lymphocytes: 2291 {cells}/uL (ref 850–3900)
Absolute Monocytes: 493 {cells}/uL (ref 200–950)
Basophils Absolute: 90 {cells}/uL (ref 0–200)
Basophils Relative: 1.4 %
Eosinophils Absolute: 83 {cells}/uL (ref 15–500)
Eosinophils Relative: 1.3 %
HCT: 43.6 % (ref 35.0–45.0)
Hemoglobin: 14.4 g/dL (ref 11.7–15.5)
MCH: 30.5 pg (ref 27.0–33.0)
MCHC: 33 g/dL (ref 32.0–36.0)
MCV: 92.4 fL (ref 80.0–100.0)
MPV: 10.8 fL (ref 7.5–12.5)
Monocytes Relative: 7.7 %
Neutro Abs: 3443 {cells}/uL (ref 1500–7800)
Neutrophils Relative %: 53.8 %
Platelets: 330 Thousand/uL (ref 140–400)
RBC: 4.72 Million/uL (ref 3.80–5.10)
RDW: 12.4 % (ref 11.0–15.0)
Total Lymphocyte: 35.8 %
WBC: 6.4 Thousand/uL (ref 3.8–10.8)

## 2024-03-13 LAB — HEMOGLOBIN A1C
Hgb A1c MFr Bld: 5.7 % — ABNORMAL HIGH (ref <5.7)
Mean Plasma Glucose: 117 mg/dL
eAG (mmol/L): 6.5 mmol/L

## 2024-03-13 LAB — TSH: TSH: 1.68 m[IU]/L (ref 0.40–4.50)

## 2024-03-16 ENCOUNTER — Ambulatory Visit
Admission: RE | Admit: 2024-03-16 | Discharge: 2024-03-16 | Disposition: A | Discharge status: Home / Self Care | Source: Ambulatory Visit | Attending: Family | Admitting: Family

## 2024-03-16 DIAGNOSIS — Z1231 Encounter for screening mammogram for malignant neoplasm of breast: Secondary | ICD-10-CM | POA: Diagnosis not present

## 2024-03-21 ENCOUNTER — Ambulatory Visit: Payer: Self-pay | Admitting: Family

## 2024-03-21 DIAGNOSIS — E785 Hyperlipidemia, unspecified: Secondary | ICD-10-CM

## 2024-03-21 DIAGNOSIS — I10 Essential (primary) hypertension: Secondary | ICD-10-CM

## 2024-04-09 ENCOUNTER — Encounter: Admitting: Family

## 2024-04-09 NOTE — Progress Notes (Signed)
   This encounter was created in error - please disregard. No show

## 2024-09-12 ENCOUNTER — Ambulatory Visit: Admitting: Family
